# Patient Record
Sex: Female | Born: 1935 | Race: White | Hispanic: No | Marital: Married | Smoking: Never smoker
Health system: Southern US, Community
[De-identification: ages and names within clinical notes are randomized; demographics above are authoritative.]

---

## 2015-01-14 ENCOUNTER — Other Ambulatory Visit: Payer: Self-pay | Admitting: Internal Medicine

## 2015-01-14 DIAGNOSIS — J84112 Idiopathic pulmonary fibrosis: Secondary | ICD-10-CM

## 2015-01-14 DIAGNOSIS — Z006 Encounter for examination for normal comparison and control in clinical research program: Secondary | ICD-10-CM

## 2015-01-18 ENCOUNTER — Encounter (INDEPENDENT_AMBULATORY_CARE_PROVIDER_SITE_OTHER): Payer: Self-pay

## 2015-01-18 ENCOUNTER — Other Ambulatory Visit: Payer: Self-pay | Admitting: Internal Medicine

## 2015-01-18 ENCOUNTER — Ambulatory Visit (INDEPENDENT_AMBULATORY_CARE_PROVIDER_SITE_OTHER): Payer: Medicare Other | Admitting: Internal Medicine

## 2015-01-18 ENCOUNTER — Ambulatory Visit (HOSPITAL_COMMUNITY)
Admission: RE | Admit: 2015-01-18 | Discharge: 2015-01-18 | Disposition: A | Discharge status: Home / Self Care | Payer: Medicare Other | Source: Ambulatory Visit | Attending: Internal Medicine | Admitting: Internal Medicine

## 2015-01-18 ENCOUNTER — Ambulatory Visit (INDEPENDENT_AMBULATORY_CARE_PROVIDER_SITE_OTHER)
Admission: RE | Admit: 2015-01-18 | Discharge: 2015-01-18 | Disposition: A | Discharge status: Home / Self Care | Payer: Medicare Other | Source: Ambulatory Visit | Attending: Internal Medicine | Admitting: Internal Medicine

## 2015-01-18 DIAGNOSIS — Z006 Encounter for examination for normal comparison and control in clinical research program: Secondary | ICD-10-CM | POA: Insufficient documentation

## 2015-01-18 DIAGNOSIS — J84112 Idiopathic pulmonary fibrosis: Secondary | ICD-10-CM | POA: Insufficient documentation

## 2015-01-18 LAB — PULMONARY FUNCTION TEST
DL/VA % pred: 80 %
DL/VA: 3.42 ml/min/mmHg/L
DLCO unc % pred: 48 %
DLCO unc: 9.09 ml/min/mmHg
FEF 25-75 Pre: 3.25 L/sec
FEF2575-%Pred-Pre: 239 %
FEV1-%PRED-PRE: 101 %
FEV1-Pre: 1.72 L
FEV1FVC-%Pred-Pre: 122 %
FEV6-%Pred-Pre: 86 %
FEV6-Pre: 1.86 L
FEV6FVC-%PRED-PRE: 106 %
FVC-%Pred-Pre: 81 %
FVC-Pre: 1.86 L
PRE FEV1/FVC RATIO: 92 %
Pre FEV6/FVC Ratio: 100 %

## 2015-01-18 NOTE — Patient Instructions (Signed)
ICD-9-CM ICD-10-CM   1. Research study patient V70.7 Z00.6   2. IPF (idiopathic pulmonary fibrosis) 516.31 J84.112

## 2015-01-18 NOTE — Progress Notes (Signed)
PCP Bevely Palmer (Oct. 02, 2015 - Present) [phone] (Work) [phone] (Fax)  [place], [place] [address]  DR FUlkerson - Pulmonary  Fulkerson, Chevis Pretty, MD  [facility] [address]  [place], [name] [address]  [phone]  [phone] (Fax)  S: [place] ([place].gov Identifier: [id number])  RESEARCH SUBJECT. Phase 2, randomized, double-blind, placebo-controlled study to evaluate the safety and tolerability of FG-3019 in subjects with IPF, and the efficacy of FG-3019 in slowing the loss of forced vital capacity (FVC) and the progression of IPF in these subjects. FG-3019 is a human recombinant monoclonal antibody that targets connective tissue growth factor (CTGF), which is a key factor in the pathogenesis of fibrosis. The ability of FG-3019 to block CTGF makes it a candidate for treating IPF.PRAISE is sponsored by [employer].  [employer] is a [employer] located in [place], [place]. FG-3019 is administered as an IV infusion every three weeks .    Inclusion Criteria: IPF for </= 5 years, age 51-80 and FVC is >55% and your DLCO >/30% and body weight < 130kg  Key other data: side effects with infusion - Cough > 30%, Fatigue > 20%, Dyspnea > 20%, URI > 15%, Headache > 15%, Diarrhea > 15%. Rare is flushing, arm pain, back pain and hypertension. SEverity 75% is miild-moderate. > 74% have atleast 1 side effect over time. So far none discontinued or died during treatment,. No overall difference between placebo and drug        IPF Hx per [facility]  - IPF dx nov 2012 Monica Matthews primary care and referred to pulmonary [place] -> [facility] Dr Hollace Kinnier  - symp at dx: diagnosed o physical "velcro crackles" ; asymptomatic at that time  - hx : denies autoimmune symptoms or occupational symptoms. Does not recollect autoimmune workup but Dr Cyndie Chime notes may 2013 indicates no collagen vascular disease history and no exposure hx to account for  alternate diagnosis  - symptoms currently: Phlegm in throat - mild,  Very mild occ cough, very mild dyspnea on exertion ath she hardly notices  - objective: per patient and hyusband: PFTs decline gradually +  - Followup: [facility] Dr Cyndie Chime  -Rx:    [place]key Gave [id number] x weeks - resulted in ER visit due to intolerance  Had Gyn bleeding, N+, V+, Diarrhea +. But bleeding is what stopped her Monica Matthews - Sept 2015 -> escalating doses at full dose resulted in cough that was severe. Rechallenge Jan 2016:took it up at very slow pace and as of 1 month ago still at 2tab tid but at this point when she went to 3pills tid developed rash, blisters on neck/chest and forearm and so stopped. She is off esberiet x 2 weeks   - CUrently on NAC . NOt on prednisone  PFT data Dec 2012 [facility]: FVC of 2.33(97%), FEV1 of 2.08 (115%) an FEV 1% 89 diffusion lung capacity was 11.7 (62%).    12/09/2011:FVC of 2.53 (110%) , FEV1 of 2.23 (127%), an FEV 1% of 88. Vital capacity 2.53 (110%) total lung capacity 3.75 (85%) and diffusion lung capacity 14.0 (86%).    11/10/2014: Dr Cyndie Chime at [facility] compared to his dec 2015 data:   Laboratory Studies:" Pulmonary Functions studies today compared to December are slightly decreased" FVC 2.00 (90%) compared to 2.15 (96%), FEV1 1.78 (106%) compared to 1.86 (111%) FEV! % 89 TLC 3.19 (72%) compared to 3.31 (75%) Vital Capacity 2.13 (  95%) compared to 2.31 (104%) DLCO 9.6 (61%) and 10.3 (65%)    This visit 01/18/2015  for Monica Matthews with 08-Jul-1936 who is Subject Number [id number]  is a RESEARCH Visit and is for purpose of SCREENING/BASELINE  - She is not  Considering esbriet at 2 pills tid. SHe wans to be on Study   - She is heere with husband. Both very interested in sutdy against placebo. Wanted to see data leading upto PRAISE study; went over it in detail.  She seems to meet I/E criteria  CT 11/11/13 at [facility] and read by radiologist Dr Monica Matthews  4Volumetric non-contrast chest CT acquisition was performed from the lower neck to the adrenal glands. Images were reconstructed as follows: 5 x 5 mm axial sections; 1.25 x 1.25 mm axial sections; 3 x 3 mm sagittal and coronal sections. 5 x 5 mm axial maximum intensity projection images (MIPS) were reconstructed to facilitate lung nodule detection.  Indication: 90 Postinflammatory pulmonary fibrosis, ILD  Compare: April 29, 2013, April 16, 2012, June 07, 2011  Findings:  Again seen are bilateral breast implants.  Subpleural irregular linear opacities and a few scattered groundglass attenuation opacities are stable. There may be mild traction bronchiectasis, and perhaps very early honeycombing in the posterior basis,. There is a 5 mm right lower lobe nodule image 297 series 3 which is unchanged. No new parenchymal opacities are noted. There is no axillary hilar or mediastinal lymphadenopathy. Great vessels are normal in course and caliber. Coronary artery calcification noted. No pleural or pericardial fluid. Limited noncontrast images of the upper abdomen are unremarkable. No destructive osseous lesions.  Impression:  No change from recent CT in findings, which best correspond with the ATS classification: Possible UIP pattern. When compared to the most remote prior examination, June 07, 2011, there may have been very slight interval progression.  Electronically Signed by: Monica Mc, MD Electronically Signed on: 11/11/2013 8:59 AM     PAST MED Hx from PCP notes [facility] 07/10/14 and dr Cyndie Chime pulm   IPF-- follow up pulmonology [facility]  Systolic murmur-- echo (21/30/86): EF >55%, mild pulmonary htn, mild MR, mild AR.   htn-- controlled. Home bp reasonable.  Hypothyroidism-- current regimen includes levothyroxine 75 mcg daily. TSH 0.55 (04/20/14). Check TSH.   Allergic rhinitis-- current regimen includes flonase.   Migraine  headaches-- current regimen includes imitrex prn.   Anxiety-- current regimen includes xanax 0.5 mg nightly for sleep for many years  GERD    Normocytic anemia-- hgb 11.4 (baseline 12.2 as of 05/2014).  OTHER CHART INFO from [facility]   ECHO 06/02/14 - [facility]  ECG rhythm::Sinus rhythm.  Study quality::This was a technically adequate study.  Left Ventricle::The left ventricle size is normal. Left ventricular wall thickness  is normal. Regional wall motion analysis is within normal limits.  Overall left ventricular systolic function is normal, with an EF of  >55%.  Left Atrium::The left atrium is normal in size and function.  Right Ventricle::The right ventricle is normal in size and function.  Right Atrium::The right atrium is normal in size and function.  Aortic Valve::Aortic valve is trileaflet and is mildly thickened. There is mild  aortic regurgitation. There is no evidence of aortic stenosis.  Mitral Valve::Mild mitral regurgitation is present. Mild mitral annular  calcification present.  Tricuspid Valve::Mild tricuspid regurgitation present. There is mild pulmonary  hypertension. The right ventricular systolic pressure, as measured  by Doppler, is 48.82mHg.  Pulmonic Valve::Pulmonic valve  appears structurally normal.  Pericardium::The pericardium is normal. There is no pericardial effusion.  Aorta::The visualized portions of the aortic root and ascending aorta are  normal.  -------------   O  Exam - documented in sheet (research) - basal 1/4/th crackes    LABS LFT 11/10/14 at [facility] - alk phos 121 and slightly up, AST 28, ALT 19 and bilit 0/.2 Creat 0.25m% CBC - hgb 11.8gm%, WBC 7/4, plt 229  [employer] done after this visit with PCP - ANA and cCP - negative 01/25/15 approx date  A/P   ICD-9-CM ICD-10-CM   1. Research study patient V70.7 Z00.6   2. IPF (idiopathic pulmonary fibrosis) 516.31 J84.112     Went over PRAISE study in  details  Other issues discussed with her and husbaned    1. Scientific Purpose  Clinical research is designed to produce generalizable knowledge and to answer questions about the safety and efficacy of intervention(s) under study in order to determine whether or not they may be useful for the care of future patients.  2. Study Procedures  Participation in a trial may involve procedures or tests, in addition to the intervention(s) under study, that are intended only or primarily to generate scientific knowledge and that are otherwise not necessary for patient care.   3. Uncertainty  For intervention(s) under study in clinical research, there often is less knowledge and more uncertainty about the risks and benefits to a population of trial participants than there is when a doctor offers a patient standard interventions.   4. Adherence to Protocol  Administration of the intervention(s) under study is typically based on a strict protocol with defined dose, scheduling, and use or avoidance of concurrent medications, compared to administration of standard interventions.  5. Clinician as Investigator  Clinicians who are in health care settings provide treatment; in a clinical trial setting, they are also investigating safety and efficacy of an intervention. In otherwise your doctor or nurse practitioner can be wearing 2 hats - one as care giver another as rCompany secretary In this particular situation her clinician pulmonary is Dr [facility]yndie [facility]at [facility]and we at PHudson Hospitalwill be her investigators.   6. Patient as VVisual merchandiserSubject  Patients participating in research trials are research subjects or volunteers. In other words participating in research is 100% voluntary and at one's own free weill. The decision to participate or not participate will NOT affect patient care and the doctor-patient relationship in any way. Advised she will be wearing a rResearch scientist (physical sciences)hat with  [place] 7. Financial Conflict of Interest Disclosure PI and SubIs have  investment interest in [employer] the clinical trials site and  both the company and the investigators are being compensated for their effort in trial research activities. At no point is anyone being directly compensated merely for referring you to research participation  She has agreed to proceed with the process - will need research PFT and CT chest - these will nto be standard of care   Dr. MBrand Males M.D., FEnloe Medical Center - Cohasset CampusC.P Pulmonary and Critical Care Medicine Staff Physician CMonumentPulmonary and Critical Care Pager: [phone] If no answer or between  15:00h - 7:00h: call [phone]  01/28/2015 5:39 AM

## 2015-01-28 ENCOUNTER — Inpatient Hospital Stay (HOSPITAL_COMMUNITY): Admission: RE | Admit: 2015-01-28 | Payer: Medicare Other | Source: Ambulatory Visit

## 2015-01-29 ENCOUNTER — Other Ambulatory Visit: Payer: Self-pay | Admitting: *Deleted

## 2015-01-29 ENCOUNTER — Ambulatory Visit (HOSPITAL_COMMUNITY)
Admission: RE | Admit: 2015-01-29 | Discharge: 2015-01-29 | Disposition: A | Discharge status: Home / Self Care | Payer: Medicare Other | Source: Ambulatory Visit | Attending: Internal Medicine | Admitting: Internal Medicine

## 2015-01-29 ENCOUNTER — Ambulatory Visit (INDEPENDENT_AMBULATORY_CARE_PROVIDER_SITE_OTHER): Payer: Medicare Other | Admitting: Internal Medicine

## 2015-01-29 DIAGNOSIS — Z006 Encounter for examination for normal comparison and control in clinical research program: Secondary | ICD-10-CM | POA: Diagnosis not present

## 2015-01-29 DIAGNOSIS — J84112 Idiopathic pulmonary fibrosis: Secondary | ICD-10-CM | POA: Diagnosis not present

## 2015-01-29 MED ORDER — STUDY - INVESTIGATIONAL DRUG SIMPLE RECORD (ML): 250.0000 mL/h | Status: DC

## 2015-01-29 MED ORDER — STUDY - INVESTIGATIONAL DRUG SIMPLE RECORD (ML)
125.0000 mL/h | Freq: Once | Status: AC
  Administered 2015-01-29: 125 mL/h via INTRAVENOUS
  Filled 2015-01-29: qty 180

## 2015-01-29 MED ORDER — STUDY - INVESTIGATIONAL DRUG SIMPLE RECORD (ML)
125.0000 mL/h | Freq: Once | Status: DC
  Filled 2015-01-29: qty 180

## 2015-01-29 NOTE — Progress Notes (Signed)
Dr. Marchelle Gearing explained side effects to patient and family member. Both asked questions and verbalized understanding.Research nurse at bedside.

## 2015-02-18 ENCOUNTER — Encounter (HOSPITAL_COMMUNITY)
Admission: RE | Admit: 2015-02-18 | Discharge: 2015-02-18 | Disposition: A | Discharge status: Home / Self Care | Payer: Medicare Other | Source: Ambulatory Visit | Attending: Internal Medicine | Admitting: Internal Medicine

## 2015-02-18 DIAGNOSIS — Z006 Encounter for examination for normal comparison and control in clinical research program: Secondary | ICD-10-CM | POA: Insufficient documentation

## 2015-02-18 DIAGNOSIS — J84112 Idiopathic pulmonary fibrosis: Secondary | ICD-10-CM | POA: Insufficient documentation

## 2015-02-18 MED ORDER — STUDY - INVESTIGATIONAL DRUG SIMPLE RECORD (ML)
250.0000 mL/h | Freq: Once | Status: AC
  Administered 2015-02-18: 250 mL/h via INTRAVENOUS
  Filled 2015-02-18: qty 177.6

## 2015-03-10 ENCOUNTER — Ambulatory Visit (INDEPENDENT_AMBULATORY_CARE_PROVIDER_SITE_OTHER): Payer: Medicare Other | Admitting: Adult Health

## 2015-03-10 ENCOUNTER — Ambulatory Visit (HOSPITAL_COMMUNITY)
Admission: RE | Admit: 2015-03-10 | Discharge: 2015-03-10 | Disposition: A | Discharge status: Home / Self Care | Payer: Medicare Other | Source: Ambulatory Visit | Attending: Internal Medicine | Admitting: Internal Medicine

## 2015-03-10 VITALS — BP 102/80 | HR 76 | Temp 97.6°F | Resp 16 | Wt 131.0 lb

## 2015-03-10 DIAGNOSIS — Z006 Encounter for examination for normal comparison and control in clinical research program: Secondary | ICD-10-CM | POA: Insufficient documentation

## 2015-03-10 DIAGNOSIS — J84112 Idiopathic pulmonary fibrosis: Secondary | ICD-10-CM

## 2015-03-10 MED ORDER — STUDY - INVESTIGATIONAL DRUG SIMPLE RECORD (ML)
125.0000 mL/h | Freq: Once | Status: AC
  Administered 2015-03-10: 500 mL/h via INTRAVENOUS
  Filled 2015-03-10: qty 178.2

## 2015-03-10 NOTE — Patient Instructions (Signed)
Continue with research protocol.  

## 2015-03-10 NOTE — Assessment & Plan Note (Signed)
Compensated without flare 

## 2015-03-10 NOTE — Progress Notes (Signed)
PRAISE (ClinicalTrials.gov Identifier: [id number])  RESEARCH SUBJECT. Phase 2, randomized, double-blind, placebo-controlled study to evaluate the safety and tolerability of FG-3019 in subjects with IPF, and the efficacy of FG-3019 in slowing the loss of forced vital capacity (FVC) and the progression of IPF in these subjects. FG-3019 is a human recombinant monoclonal antibody that targets connective tissue growth factor (CTGF), which is a key factor in the pathogenesis of fibrosis. The ability of FG-3019 to block CTGF makes it a candidate for treating IPF.PRAISE is sponsored by [employer].  [employer] is a [employer] located in [place], [place]. FG-3019 is administered as an IV infusion every three weeks .    Inclusion Criteria: IPF for </= 5 years, age 30-80 and FVC is >55% and your DLCO >/30% and body weight < 130kg  Key other data: side effects with infusion - Cough > 30%, Fatigue > 20%, Dyspnea > 20%, URI > 15%, Headache > 15%, Diarrhea > 15%. Rare is flushing, arm pain, back pain and hypertension. SEverity 75% is miild-moderate. > 74% have atleast 1 side effect over time. So far none discontinued or died during treatment,. No overall difference between placebo and drug   This visit 03/10/2015  for Monica Matthews with 06/22/1936 who is Subject Number   is a research Visit and is for purpose of follow up with infusion to follow at [facility] and is week 6 on PROTOCOL.  Subject states, doing well....seems to be having more body heat(?).  Will go over calender prior to leaving for hospital infusion.  Subject has been randomized and Pharmacy notified of Weight.

## 2015-03-10 NOTE — Assessment & Plan Note (Signed)
Appears to be doing well with study with no apparent adverse effects.  Cont w/ research protocol

## 2015-03-10 NOTE — Progress Notes (Signed)
   Subjective:    Patient ID: Monica Matthews, female    DOB: [birth year], 79 y.o.   MRN: [id number]  HPI  PRAISE (ClinicalTrials.gov Identifier: [id number]) RESEARCH SUBJECT. Phase 2, randomized, double-blind, placebo-controlled study to evaluate the safety and tolerability of FG-3019 in subjects with IPF, and the efficacy of FG-3019 in slowing the loss of forced vital capacity (FVC) and the progression of IPF in these subjects. FG-3019 is a human recombinant monoclonal antibody that targets connective tissue growth factor (CTGF), which is a key factor in the pathogenesis of fibrosis. The ability of FG-3019 to block CTGF makes it a candidate for treating IPF.PRAISE is sponsored by [employer]. [employer] is a [employer] located in [place], [place]. FG-3019 is administered as an IV infusion every three weeks .  Inclusion Criteria: IPF for </= 5 years, age 46-80 and FVC is >55% and your DLCO >/30% and body weight < 130kg   Key other data: side effects with infusion - Cough > 30%, Fatigue > 20%, Dyspnea > 20%, URI > 15%, Headache > 15%, Diarrhea > 15%. Rare is flushing, arm pain, back pain and hypertension. SEverity 75% is miild-moderate. > 74% have atleast 1 side effect over time. So far none discontinued or died during treatment,. No overall difference between placebo and drug   This visit 03/10/2015 for Monica Matthews with [birth year] who is Subject Number is a research Visit and is for purpose of follow up with infusion to follow at [facility] and is week 6 on PROTOCOL.   Subject states, doing well....seems to be having more body heat(?).  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  Can go up stairs with minimal dyspena//DOE.  No dyspnea at rest or at gym.  Minimal cough .  Does get fatigue at baseline, no change since starting drug therapy.  Denies chest pain, orthopnea, edema , fever, hemoptysis , n/v/d.  Good appetite    Review of Systems    Reviewed See hpi          Objective:   Physical Exam GEN: A/Ox3; pleasant , NAD, well nourished , appears younger than stated age   HEENT:  [place]/AT,  EACs-clear,, NOSE-clear, THROAT-clear, no lesions, no postnasal drip or exudate noted.   NECK:  Supple w/ fair ROM; no JVD; normal carotid impulses w/o bruits; no thyromegaly or nodules palpated; no lymphadenopathy.  RESP  Bibasilar crackles w/ no wheezing .no accessory muscle use, no dullness to percussion  CARD:  RRR, no m/r/g  , no peripheral edema, pulses intact, no cyanosis or clubbing.  GI:   Soft & nt;   Musco: Warm bil, no deformities or joint swelling noted.   Neuro: alert, no focal deficits noted.    Skin: Warm, no lesions or rashes '       Assessment & Plan:

## 2015-04-05 ENCOUNTER — Encounter (HOSPITAL_COMMUNITY)
Admission: RE | Admit: 2015-04-05 | Discharge: 2015-04-05 | Disposition: A | Discharge status: Home / Self Care | Payer: Medicare Other | Source: Ambulatory Visit | Attending: Internal Medicine | Admitting: Internal Medicine

## 2015-04-05 DIAGNOSIS — Z006 Encounter for examination for normal comparison and control in clinical research program: Secondary | ICD-10-CM | POA: Insufficient documentation

## 2015-04-05 DIAGNOSIS — J84112 Idiopathic pulmonary fibrosis: Secondary | ICD-10-CM | POA: Insufficient documentation

## 2015-04-05 MED ORDER — STUDY - INVESTIGATIONAL DRUG SIMPLE RECORD (ML)
125.0000 mL/h | Freq: Once | Status: AC
  Administered 2015-04-05: 500 mL/h via INTRAVENOUS
  Filled 2015-04-05: qty 177.6

## 2015-04-26 ENCOUNTER — Encounter (HOSPITAL_COMMUNITY)
Admission: RE | Admit: 2015-04-26 | Discharge: 2015-04-26 | Disposition: A | Discharge status: Home / Self Care | Payer: Medicare Other | Source: Ambulatory Visit | Attending: Internal Medicine | Admitting: Internal Medicine

## 2015-04-26 ENCOUNTER — Ambulatory Visit (INDEPENDENT_AMBULATORY_CARE_PROVIDER_SITE_OTHER): Payer: Medicare Other | Admitting: Adult Health

## 2015-04-26 VITALS — BP 110/76 | HR 65 | Temp 97.5°F | Resp 16 | Wt 132.4 lb

## 2015-04-26 DIAGNOSIS — J84112 Idiopathic pulmonary fibrosis: Secondary | ICD-10-CM | POA: Insufficient documentation

## 2015-04-26 DIAGNOSIS — Z006 Encounter for examination for normal comparison and control in clinical research program: Secondary | ICD-10-CM | POA: Insufficient documentation

## 2015-04-26 MED ORDER — STUDY - INVESTIGATIONAL DRUG SIMPLE RECORD (ML)
125.0000 mL/h | Freq: Once | Status: AC
  Administered 2015-04-26: 500 mL/h via INTRAVENOUS
  Filled 2015-04-26: qty 180.3

## 2015-04-26 MED ORDER — STUDY - INVESTIGATIONAL DRUG SIMPLE RECORD (ML)
125.0000 mL/h | Freq: Once | Status: AC
  Filled 2015-04-26 (×3): qty 180.3

## 2015-04-26 NOTE — Assessment & Plan Note (Signed)
Compensated on present regimen .  Cont with follow up with Dr. Marchelle Gearing as planned

## 2015-04-26 NOTE — Assessment & Plan Note (Signed)
Cont with research protocol  

## 2015-04-26 NOTE — Progress Notes (Signed)
   Subjective:    Patient ID: Monica Matthews, female    DOB: 04/16/1936, 79 y.o.   MRN: [id number]  HPI   PRAISE (ClinicalTrials.gov Identifier: [id number]) RESEARCH SUBJECT. Phase 2, randomized, double-blind, placebo-controlled study to evaluate the safety and tolerability of FG-3019 in subjects with IPF, and the efficacy of FG-3019 in slowing the loss of forced vital capacity (FVC) and the progression of IPF in these subjects. FG-3019 is a human recombinant monoclonal antibody that targets connective tissue growth factor (CTGF), which is a key factor in the pathogenesis of fibrosis. The ability of FG-3019 to block CTGF makes it a candidate for treating IPF.PRAISE is sponsored by [employer]. [employer] is a [employer] located in [place], [place]. FG-3019 is administered as an IV infusion every three weeks .  Inclusion Criteria: IPF for </= 5 years, age 23-80 and FVC is >55% and your DLCO >/30% and body weight < 130kg   Key other data: side effects with infusion - Cough > 30%, Fatigue > 20%, Dyspnea > 20%, URI > 15%, Headache > 15%, Diarrhea > 15%. Rare is flushing, arm pain, back pain and hypertension. SEverity 75% is miild-moderate. > 74% have atleast 1 side effect over time. So far none discontinued or died during treatment,. No overall difference between placebo and drug   04/26/2015 Research visit : Subject Number  number 7 on PROTOCOL. Pt returns for research visit .  Says she is doing well overall.  PFT today reviewed with 10% decline noted in FVC 83 last visit and 79% today  Has been treated for a UTI , on abx . Feels better.  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  Can go up stairs with minimal dyspena//DOE.  No dyspnea at rest or at gym.  Minimal cough .  Does get fatigue at baseline, no change since starting drug therapy.  Had one episode of fluttering sensation inside body that resolved shortly with no intervention    Denies chest pain or syncope. Good appetite   Review of Systems    Reviewed See hpi          Objective:   Physical Exam  GEN: A/Ox3; pleasant , NAD, well nourished , appears younger than stated age   HEENT:  [place]/AT,  EACs-clear,, NOSE-clear, THROAT-clear, no lesions, no postnasal drip or exudate noted.   NECK:  Supple w/ fair ROM; no JVD; normal carotid impulses w/o bruits; no thyromegaly or nodules palpated; no lymphadenopathy.  RESP  Bibasilar crackles w/ no wheezing .no accessory muscle use, no dullness to percussion  CARD:  RRR, no m/r/g  , no peripheral edema, pulses intact, no cyanosis or clubbing.  GI:   Soft & nt;   Musco: Warm bil, no deformities or joint swelling noted.   Neuro: alert, no focal deficits noted.    Skin: Warm, no lesions or rashes '       Assessment & Plan:

## 2015-04-26 NOTE — Progress Notes (Signed)
PRAISE (ClinicalTrials.gov Identifier: [id number])  RESEARCH SUBJECT. Phase 2, randomized, double-blind, placebo-controlled study to evaluate the safety and tolerability of FG-3019 in subjects with IPF, and the efficacy of FG-3019 in slowing the loss of forced vital capacity (FVC) and the progression of IPF in these subjects. FG-3019 is a human recombinant monoclonal antibody that targets connective tissue growth factor (CTGF), which is a key factor in the pathogenesis of fibrosis. The ability of FG-3019 to block CTGF makes it a candidate for treating IPF.PRAISE is sponsored by [employer].  [employer] is a [employer] located in [place], [place]. FG-3019 is administered as an IV infusion every three weeks .    Inclusion Criteria: IPF for </= 5 years, age 82-80 and FVC is >55% and your DLCO >/30% and body weight < 130kg  Key other data: side effects with infusion - Cough > 30%, Fatigue > 20%, Dyspnea > 20%, URI > 15%, Headache > 15%, Diarrhea > 15%. Rare is flushing, arm pain, back pain and hypertension. SEverity 75% is miild-moderate. > 74% have atleast 1 side effect over time. So far none discontinued or died during treatment,. No overall difference between placebo and drug   This visit 04/26/2015  for Monica Matthews with 12/27/1935 who is Subject Number [id number] is a research Visit and is for purpose of follow up and is number 7 on PROTOCOL. Subject is doing well, has been treated for a UTI and is finishing up antibiotic, this is documented on AE form.  Will head to hospital following appt with tammy .

## 2015-04-26 NOTE — Patient Instructions (Signed)
Continue with research protocol.  

## 2015-05-17 ENCOUNTER — Encounter (HOSPITAL_COMMUNITY): Payer: Medicare Other

## 2015-05-21 ENCOUNTER — Encounter (HOSPITAL_COMMUNITY)
Admission: RE | Admit: 2015-05-21 | Discharge: 2015-05-21 | Disposition: A | Discharge status: Home / Self Care | Payer: Medicare Other | Source: Ambulatory Visit | Attending: Internal Medicine | Admitting: Internal Medicine

## 2015-05-21 MED ORDER — STUDY - INVESTIGATIONAL DRUG SIMPLE RECORD (ML)
125.0000 mL/h | Freq: Once | Status: AC
  Administered 2015-05-21: 125 mL/h via INTRAVENOUS
  Filled 2015-05-21: qty 180.3
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
## 2015-06-07 ENCOUNTER — Ambulatory Visit (INDEPENDENT_AMBULATORY_CARE_PROVIDER_SITE_OTHER): Payer: Medicare Other | Admitting: Adult Health

## 2015-06-07 ENCOUNTER — Encounter (HOSPITAL_COMMUNITY)
Admission: RE | Admit: 2015-06-07 | Discharge: 2015-06-07 | Disposition: A | Discharge status: Home / Self Care | Payer: Medicare Other | Source: Ambulatory Visit | Attending: Internal Medicine | Admitting: Internal Medicine

## 2015-06-07 VITALS — BP 102/74 | HR 62 | Temp 98.2°F | Resp 17 | Wt 132.2 lb

## 2015-06-07 DIAGNOSIS — Z006 Encounter for examination for normal comparison and control in clinical research program: Secondary | ICD-10-CM

## 2015-06-07 DIAGNOSIS — J84112 Idiopathic pulmonary fibrosis: Secondary | ICD-10-CM

## 2015-06-07 MED ORDER — STUDY - INVESTIGATIONAL DRUG SIMPLE RECORD (ML)
125.0000 mL/h | Freq: Once | Status: AC
  Administered 2015-06-07: 500 mL/h via INTRAVENOUS
  Filled 2015-06-07: qty 180.03

## 2015-06-07 NOTE — Patient Instructions (Addendum)
Continue with research protocol .  May use saline nasal spray As needed

## 2015-06-07 NOTE — Progress Notes (Signed)
   Subjective:    Patient ID: Monica Matthews, female    DOB: Jan 15, 1936, 79 y.o.   MRN: [id number]  HPI PRAISE (ClinicalTrials.gov Identifier: [id number]) RESEARCH SUBJECT. Phase 2, randomized, double-blind, placebo-controlled study to evaluate the safety and tolerability of FG-3019 in subjects with IPF, and the efficacy of FG-3019 in slowing the loss of forced vital capacity (FVC) and the progression of IPF in these subjects. FG-3019 is a human recombinant monoclonal antibody that targets connective tissue growth factor (CTGF), which is a key factor in the pathogenesis of fibrosis. The ability of FG-3019 to block CTGF makes it a candidate for treating IPF.PRAISE is sponsored by [employer] is a [employer] company located in [place], [place]. FG-3019 is administered as an IV infusion every three weeks .  Inclusion Criteria: IPF for </= 5 years, age 340-80 and FVC is >55% and your DLCO >/30% and body weight < 130kg   Key other data: side effects with infusion - Cough > 30%, Fatigue > 20%, Dyspnea > 20%, URI > 15%, Headache > 15%, Diarrhea > 15%. Rare is flushing, arm pain, back pain and hypertension. SEverity 75% is miild-moderate. > 74% have atleast 1 side effect over time. So far none discontinued or died during treatment,. No overall difference between placebo and drug   06/07/2015  Research visit : Pt returns for research visit .  Says she is doing well overall.   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  Has had some Sore throat and drainage. We discussed saline nasal spray As needed   She denies chest pain, hemoptysis , orthopnea or edema.   Review of Systems    Reviewed See hpi          Objective:   Physical Exam  GEN: A/Ox3; pleasant , NAD, well nourished , appears younger than stated age   HEENT:  [place]/AT,  EACs-clear,, NOSE-clear, THROAT-clear, no lesions, no postnasal drip or exudate noted.   NECK:  Supple w/ fair ROM; no JVD; normal carotid  impulses w/o bruits; no thyromegaly or nodules palpated; no lymphadenopathy.  RESP  Bibasilar crackles w/ no wheezing .no accessory muscle use, no dullness to percussion  CARD:  RRR, no m/r/g  , no peripheral edema, pulses intact, no cyanosis or clubbing.  GI:   Soft & nt;   Musco: Warm bil, no deformities or joint swelling noted.   Neuro: alert, no focal deficits noted.    Skin: Warm, no lesions or rashes '       Assessment & Plan:

## 2015-06-07 NOTE — Progress Notes (Signed)
[facility] (ClinicalTrials.gov Identifier: [id number])  RESEARCH SUBJECT. Phase 2, randomized, double-blind, placebo-controlled study to evaluate the safety and tolerability of FG-3019 in subjects with IPF, and the efficacy of FG-3019 in slowing the loss of forced vital capacity (FVC) and the progression of IPF in these subjects. FG-3019 is a human recombinant monoclonal antibody that targets connective tissue growth factor (CTGF), which is a key factor in the pathogenesis of fibrosis. The ability of FG-3019 to block CTGF makes it a candidate for treating IPF.[facility]ISE is sponsored by [employer]  [employer] is a [employer] company located in [place], [place]. FG-3019 is administered as an IV infusion every three weeks .    Inclusion Criteria: IPF for </= 5 years, age 77-80 and FVC is >55% and your DLCO >/30% and body weight < 130kg  Key other data: side effects with infusion - Cough > 30%, Fatigue > 20%, Dyspnea > 20%, URI > 15%, Headache > 15%, Diarrhea > 15%. Rare is flushing, arm pain, back pain and hypertension. SEverity 75% is miild-moderate. > 74% have atleast 1 side effect over time. So far none discontinued or died during treatment,. No overall difference between placebo and drug   This visit 06/07/2015  for Monica Matthews with 1935-12-17 who is Subject Number [id number]  is a research Visit and is for purpose of treatment  and is number Week 18 on PROTOCOL.  Subject returns, is doing well.  Husband had many questions about what will happen at end of 48 weeks, unable to answer those at this time.  Monica Matthews notified subject put in end point.

## 2015-06-07 NOTE — Assessment & Plan Note (Signed)
Appears at baseline Cont w/ current regimen

## 2015-06-07 NOTE — Assessment & Plan Note (Signed)
Cont w/ research protocol  

## 2015-06-07 NOTE — Progress Notes (Signed)
Research nurse here with patient.

## 2015-06-08 ENCOUNTER — Telehealth: Payer: Self-pay | Admitting: Internal Medicine

## 2015-06-08 NOTE — Telephone Encounter (Signed)
Subject called complaining of being blurry this am, was treated yesterday 31Oct2016 for Fibrogen.  Is concerned this is an Adverse Reaction to the study drug.  Spoke with monitor, Vernard GamblesCarolyn Coleman who was not aware of any type of information relating to blurred vision following treatment.  Advised to call Dr. Tresa EndoGorina (medical monitor for Fibrogen).  Per Dr. Marchelle Gearingamaswamy after speaking with Dr. Tresa EndoGorina, there is <10% reported cases of blurred vision and none were study drug related.  This information has been relayed to subject and also advised an appointment with her primary care provider today.  Subject was receptive to information and stated she had an appointment already scheduled for today at 1645 with her Dr. For a "Check up".  Asked subject to follow up with me to let me know how she makes out, she stated she would and was appreciative of the quick response given by our office.  Dr. Marchelle Gearingamaswamy is willing to speak with primary MD if there is a need.

## 2015-06-10 NOTE — Patient Instructions (Signed)
ICD-9-CM ICD-10-CM   1. Research study patient V70.7 Z00.6   2. IPF (idiopathic pulmonary fibrosis) (HCC) 516.31 J84.112     Infusion per proptocol

## 2015-06-10 NOTE — Progress Notes (Signed)
PCP Assunta Gamblesaniel P Dawson (Oct. 02, 2015 - Present) [phone] (Work) [phone] (Fax)  [address] [place], [place] [address]  DR Guido SanderFUlkerson - Pulmonary  Guido SanderFulkerson, Juluis MireWilliam Jennings, MD  [address] [facility] [address]  [facility] [address]  [place], [place] [address]  [phone]  [phone] (Fax)  S: [facility] (ClinicalTrials.gov Identifier: [id number])  RESEARCH SUBJECT. Phase 2, randomized, double-blind, placebo-controlled study to evaluate the safety and tolerability of FG-3019 in subjects with IPF, and the efficacy of FG-3019 in slowing the loss of forced vital capacity (FVC) and the progression of IPF in these subjects. FG-3019 is a human recombinant monoclonal antibody that targets connective tissue growth factor (CTGF), which is a key factor in the pathogenesis of fibrosis. The ability of FG-3019 to block CTGF makes it a candidate for treating IPF.[facility]ISE is sponsored by [employer]  [employer] is a [employer] company located in [place]. FG-3019 is administered as an IV infusion every three weeks .    Inclusion Criteria: IPF for </= 5 years, age 77-80 and FVC is >55% and your DLCO >/30% and body weight < 130kg  Key other data: side effects with infusion - Cough > 30%, Fatigue > 20%, Dyspnea > 20%, URI > 15%, Headache > 15%, Diarrhea > 15%. Rare is flushing, arm pain, back pain and hypertension. SEverity 75% is miild-moderate. > 74% have atleast 1 side effect over time. So far none discontinued or died during treatment,. No overall difference between placebo and drug   June 24th is infusion visit. Endorses no complaints  O No change since prior visit this month See research source doc  A   ICD-9-CM ICD-10-CM   1. Research study patient V70.7 Z00.6   2. IPF (idiopathic pulmonary fibrosis) (HCC) 516.31 J84.112    P Infusion per protocol   Dr. Kalman ShanMurali Kasidee Voisin, M.D., [facility].P Pulmonary and Critical Care Medicine Staff Physician [facility] [place] [facility]  Critical Care Pager: [phone], If no answer or between  15:00h - 7:00h: call [phone]  06/10/2015 2:41 PM    Note signed many months later  - fyi

## 2015-06-28 ENCOUNTER — Ambulatory Visit (HOSPITAL_COMMUNITY)
Admission: RE | Admit: 2015-06-28 | Discharge: 2015-06-28 | Disposition: A | Discharge status: Home / Self Care | Payer: Medicare Other | Source: Ambulatory Visit | Attending: Internal Medicine | Admitting: Internal Medicine

## 2015-06-28 MED ORDER — SODIUM CHLORIDE 0.9 % IV SOLN
125.0000 mL/h | Freq: Once | INTRAVENOUS | Status: AC
  Administered 2015-06-28: 500 mL/h via INTRAVENOUS
  Filled 2015-06-28: qty 180.3

## 2015-06-28 NOTE — Research (Signed)
This patient has arrived for Week 21 infusion.  Patient reports new feelings of anxiety and depression, starting at end October 2016.  Patient reports had feelings of anxiety and depression at previous infusion on 06/07/15 but did not mention to study staff.  Patient decided to go to primary care provider for anxiety/depressiion in mid-November.  New prescription of sertraline 25mg  daily prescribed, and patient began taking on 06/26/15.  No changes in feelings of anxiety/depression yet.  Otherwise, no new medications or adverse events.  This RN was present for the initiation of the infusion.  Patient tolerating well, no complaints associated with infusion.  Kinnie FeilStacey Phelps, RN

## 2015-07-19 ENCOUNTER — Ambulatory Visit (HOSPITAL_COMMUNITY)
Admission: RE | Admit: 2015-07-19 | Discharge: 2015-07-19 | Disposition: A | Discharge status: Home / Self Care | Payer: Medicare Other | Source: Ambulatory Visit | Attending: Internal Medicine | Admitting: Internal Medicine

## 2015-07-19 ENCOUNTER — Other Ambulatory Visit: Payer: Self-pay | Admitting: Internal Medicine

## 2015-07-19 ENCOUNTER — Encounter: Payer: Self-pay | Admitting: Adult Health

## 2015-07-19 ENCOUNTER — Ambulatory Visit (INDEPENDENT_AMBULATORY_CARE_PROVIDER_SITE_OTHER): Payer: Medicare Other | Admitting: Adult Health

## 2015-07-19 ENCOUNTER — Encounter: Payer: Medicare Other | Admitting: Internal Medicine

## 2015-07-19 VITALS — BP 110/80 | HR 74 | Temp 97.5°F | Resp 16 | Wt 131.2 lb

## 2015-07-19 DIAGNOSIS — J84112 Idiopathic pulmonary fibrosis: Secondary | ICD-10-CM | POA: Diagnosis not present

## 2015-07-19 DIAGNOSIS — Z006 Encounter for examination for normal comparison and control in clinical research program: Secondary | ICD-10-CM

## 2015-07-19 MED ORDER — STUDY - INVESTIGATIONAL DRUG SIMPLE RECORD (ML)
125.0000 mL/h | Freq: Once | Status: AC
  Administered 2015-07-19: 500 mL/h via INTRAVENOUS
  Filled 2015-07-19: qty 179

## 2015-07-19 NOTE — Progress Notes (Signed)
   Subjective:    Patient ID: Monica Matthews, female    DOB: October 20, 1935, 79 y.o.   MRN: [id number]  HPI [facility] (ClinicalTrials.gov Identifier: [id number]) RESEARCH SUBJECT. Phase 2, randomized, double-blind, placebo-controlled study to evaluate the safety and tolerability of FG-3019 in subjects with IPF, and the efficacy of FG-3019 in slowing the loss of forced vital capacity (FVC) and the progression of IPF in these subjects. FG-3019 is a human recombinant monoclonal antibody that targets connective tissue growth factor (CTGF), which is a key factor in the pathogenesis of fibrosis. The ability of FG-3019 to block CTGF makes it a candidate for treating IPF.[facility] is sponsored by [employer] is a [employer] company located in [place], [place]. FG-3019 is administered as an IV infusion every three weeks .  Inclusion Criteria: IPF for </= 5 years, age 77-80 and FVC is >55% and your DLCO >/30% and body weight < 130kg   Key other data: side effects with infusion - Cough > 30%, Fatigue > 20%, Dyspnea > 20%, URI > 15%, Headache > 15%, Diarrhea > 15%. Rare is flushing, arm pain, back pain and hypertension. SEverity 75% is miild-moderate. > 74% have atleast 1 side effect over time. So far none discontinued or died during treatment,. No overall difference between placebo and drug   07/19/2015  Research visit :Week 24  Pt returns for research visit .  Says she is doing well overall.   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  Has had some Sore throat and drainage. She denies chest pain, hemoptysis , orthopnea or edema.   Review of Systems  +constipation and +fatigue          Objective:   Physical Exam  GEN: A/Ox3; pleasant , NAD, well nourished , appears younger than stated age   HEENT:  [place]/AT,  EACs-clear,, NOSE-clear, THROAT-clear, no lesions, no postnasal drip or exudate noted.   NECK:  Supple w/ fair ROM; no JVD; normal carotid  impulses w/o bruits; no thyromegaly or nodules palpated; no lymphadenopathy.  RESP  Bibasilar crackles w/ no wheezing .no accessory muscle use, no dullness to percussion  CARD:  RRR, no m/r/g  , no peripheral edema, pulses intact, no cyanosis or clubbing.  GI:   Soft & nt;   Musco: Warm bil, no deformities or joint swelling noted.   Neuro: alert, no focal deficits noted.    Skin: Warm, no lesions or rashes '       Assessment & Plan:

## 2015-07-19 NOTE — Assessment & Plan Note (Signed)
Doing well on study drugy  Cont w/ research protocol .

## 2015-07-19 NOTE — Research (Addendum)
[facility] (ClinicalTrials.gov Identifier: [id number])  RESEARCH SUBJECT. Phase 2, randomized, double-blind, placebo-controlled study to evaluate the safety and tolerability of FG-3019 in subjects with IPF, and the efficacy of FG-3019 in slowing the loss of forced vital capacity (FVC) and the progression of IPF in these subjects. FG-3019 is a human recombinant monoclonal antibody that targets connective tissue growth factor (CTGF), which is a key factor in the pathogenesis of fibrosis. The ability of FG-3019 to block CTGF makes it a candidate for treating IPF.[facility]AISE is sponsored by [employer]  [employer] is a [employer] company located in [place], [place]. FG-3019 is administered as an IV infusion every three weeks .    Inclusion Criteria: IPF for </= 5 years, age 77-80 and FVC is >55% and your DLCO >/30% and body weight < 130kg  Key other data: side effects with infusion - Cough > 30%, Fatigue > 20%, Dyspnea > 20%, URI > 15%, Headache > 15%, Diarrhea > 15%. Rare is flushing, arm pain, back pain and hypertension. SEverity 75% is miild-moderate. > 74% have atleast 1 side effect over time. So far none discontinued or died during treatment,. No overall difference between placebo and drug   This visit 07/19/2015  for Monica Matthews who is Subject Number [id number]  is a Research Visit and is for purpose of Week 24 on PROTOCOL.     Patient at hospital today for scheduled week 24 infusion.  Patient reported stop use of sertraline after a few days on the medication.  She reports medication made her feel anxious and did not improve symptoms.  Patient says she is a "strong person, mentally," and "worked through" her feelings of anxiety and depression on her own.  She says her feelings of anxiety and depression were triggered by the loss of two long-time, elderly pets during a short period of time.  Patient reports she feels back to baseline today.  Patient reports no other changes in medications or  health.  This RN was present for the initiation and administration of study drug infusion.  Patient tolerating infusion well. Patient will be monitored by bedside RN staff for observation period.  Kinnie FeilStacey Phelps, RN

## 2015-07-19 NOTE — Patient Instructions (Signed)
Continue with research protocol.  

## 2015-07-19 NOTE — Assessment & Plan Note (Signed)
Compensated without flare  Cont follow up with Dr. Marchelle Gearingamaswamy

## 2015-07-21 NOTE — Progress Notes (Signed)
PI note - d/w CRC who was in room with SUb-I  - fatigue is NEW - since last infusion. Moderate. She is driving a lot 1.6X3.5h for the infusions. She is planning to fly now. Possibly related to study drug but possibly related to travel itself  - constiplation, sore throat , sinus draiage - are al past hx  Dr. Kalman ShanMurali Meeya Goldin, M.D., [facility].P Pulmonary and Critical Care Medicine Staff Physician [facility] [place] [facility] and Critical Care Pager: [phone], If no answer or between  15:00h - 7:00h: call [phone]  07/21/2015 7:47 AM

## 2015-08-12 ENCOUNTER — Encounter (HOSPITAL_COMMUNITY): Payer: Self-pay

## 2015-08-12 ENCOUNTER — Encounter (HOSPITAL_COMMUNITY)
Admission: RE | Admit: 2015-08-12 | Discharge: 2015-08-12 | Disposition: A | Discharge status: Home / Self Care | Payer: Medicare Other | Source: Ambulatory Visit | Attending: Internal Medicine | Admitting: Internal Medicine

## 2015-08-12 ENCOUNTER — Other Ambulatory Visit: Payer: Self-pay | Admitting: Internal Medicine

## 2015-08-12 DIAGNOSIS — Z006 Encounter for examination for normal comparison and control in clinical research program: Secondary | ICD-10-CM | POA: Insufficient documentation

## 2015-08-12 DIAGNOSIS — J84112 Idiopathic pulmonary fibrosis: Secondary | ICD-10-CM

## 2015-08-12 MED ORDER — SODIUM CHLORIDE 0.9 % IV SOLN
125.0000 mL/h | Freq: Once | INTRAVENOUS | Status: AC
  Administered 2015-08-12: 500 mL/h via INTRAVENOUS
  Filled 2015-08-12: qty 180.3

## 2015-08-12 NOTE — Research (Signed)
[facility] (ClinicalTrials.gov Identifier: [id number])  RESEARCH SUBJECT. Phase 2, randomized, double-blind, placebo-controlled study to evaluate the safety and tolerability of FG-3019 in subjects with IPF, and the efficacy of FG-3019 in slowing the loss of forced vital capacity (FVC) and the progression of IPF in these subjects. FG-3019 is a human recombinant monoclonal antibody that targets connective tissue growth factor (CTGF), which is a key factor in the pathogenesis of fibrosis. The ability of FG-3019 to block CTGF makes it a candidate for treating IPF.[facility]AISE is sponsored by [employer]  [employer] is a [employer] company located in [place], [place]. FG-3019 is administered as an IV infusion every three weeks .    Inclusion Criteria: IPF for </= 5 years, age 78-80 and FVC is >55% and your DLCO >/30% and body weight < 130kg  Key other data: side effects with infusion - Cough > 30%, Fatigue > 20%, Dyspnea > 20%, URI > 15%, Headache > 15%, Diarrhea > 15%. Rare is flushing, arm pain, back pain and hypertension. SEverity 75% is miild-moderate. > 74% have atleast 1 side effect over time. So far none discontinued or died during treatment,. No overall difference between placebo and drug   This visit 08/12/2015  for Monica Matthews with [birth year] who is Subject Number [id number] is a Research Visit and is for purpose of Week 27 on PROTOCOL.  Patient present today for week 27 infusion of study drug/placebo.  Infusion completed outside of window, due to travel constraints.  Patient reports no changes in health or medications.  Patient feeling well and in good spirits due to recently received good [facility].    This RN initiated infusion, patient tolerating well.  Bedside RN staff will continue administration monitoring of patient for duration of infusion and observation period.  Kinnie FeilStacey Emily Massar, RN

## 2015-08-30 ENCOUNTER — Ambulatory Visit (INDEPENDENT_AMBULATORY_CARE_PROVIDER_SITE_OTHER)
Admission: RE | Admit: 2015-08-30 | Discharge: 2015-08-30 | Disposition: A | Discharge status: Home / Self Care | Payer: Self-pay | Source: Ambulatory Visit | Attending: Internal Medicine | Admitting: Internal Medicine

## 2015-08-30 ENCOUNTER — Encounter (HOSPITAL_COMMUNITY)
Admission: RE | Admit: 2015-08-30 | Discharge: 2015-08-30 | Disposition: A | Discharge status: Home / Self Care | Payer: Medicare Other | Source: Ambulatory Visit | Attending: Internal Medicine | Admitting: Internal Medicine

## 2015-08-30 ENCOUNTER — Encounter: Payer: Self-pay | Admitting: Adult Health

## 2015-08-30 ENCOUNTER — Ambulatory Visit (INDEPENDENT_AMBULATORY_CARE_PROVIDER_SITE_OTHER): Payer: Medicare Other | Admitting: Adult Health

## 2015-08-30 DIAGNOSIS — J84112 Idiopathic pulmonary fibrosis: Secondary | ICD-10-CM

## 2015-08-30 DIAGNOSIS — Z006 Encounter for examination for normal comparison and control in clinical research program: Secondary | ICD-10-CM

## 2015-08-30 MED ORDER — SODIUM CHLORIDE 0.9 % IV SOLN
125.0000 mL/h | Freq: Once | INTRAVENOUS | Status: AC
  Administered 2015-08-30: 500 mL/h via INTRAVENOUS
  Filled 2015-08-30: qty 180.3

## 2015-08-30 NOTE — Progress Notes (Signed)
[facility] (ClinicalTrials.gov Identifier: [id number])  RESEARCH SUBJECT. Phase 2, randomized, double-blind, placebo-controlled study to evaluate the safety and tolerability of FG-3019 in subjects with IPF, and the efficacy of FG-3019 in slowing the loss of forced vital capacity (FVC) and the progression of IPF in these subjects. FG-3019 is a human recombinant monoclonal antibody that targets connective tissue growth factor (CTGF), which is a key factor in the pathogenesis of fibrosis. The ability of FG-3019 to block CTGF makes it a candidate for treating IPF.[facility]AISE is sponsored by [employer].  [employer] is a [employer] located in [place], [place]. FG-3019 is administered as an IV infusion every three weeks .    Inclusion Criteria: IPF for </= 5 years, age 84-80 and FVC is >55% and your DLCO >/30% and body weight < 130kg  Key other data: side effects with infusion - Cough > 30%, Fatigue > 20%, Dyspnea > 20%, URI > 15%, Headache > 15%, Diarrhea > 15%. Rare is flushing, arm pain, back pain and hypertension. SEverity 75% is miild-moderate. > 74% have atleast 1 side effect over time. So far none discontinued or died during treatment,. No overall difference between placebo and drug   This visit 08/30/2015  for Monica Matthews with [birth year] who is Subject Number [id number]  is a research Visit and is for purpose of treatment and is Week 30 on protocol.  She is currently finishing up Cipro  Bid for a UTI.  This will be recorded as an AE.  No other problems at this time or changes to her meds.  After infusion, she will have her week 24 HRCT as the machine has been certified.

## 2015-08-30 NOTE — Research (Signed)
Patient infusion and observation period complete.  This RN checked on patient mid-infusion and patient resting comfortably, no further discomfort at IV site.  Patient tolerated infusion well, no complaints.  Patient going to HRCT appointment at this time.  Kinnie Feil, RN

## 2015-08-30 NOTE — Assessment & Plan Note (Signed)
Appears to be doing well on study drug with no apparent complications Cont w/ study drug protocol

## 2015-08-30 NOTE — Progress Notes (Signed)
reiviwed SUb-I note. Ok for infusion as planned  Dr. Kalman Shan, M.D., [facility].C.P Pulmonary and Critical Care Medicine Staff Physician [facility]monary and Critical Care Pager: [phone], If no answer or between  15:00h - 7:00h: call [phone]  08/30/2015 12:12 PM

## 2015-08-30 NOTE — Assessment & Plan Note (Signed)
Appears stable without flare  Cont current regimen

## 2015-08-30 NOTE — Patient Instructions (Signed)
Continue with research protocol.  

## 2015-08-30 NOTE — Research (Signed)
[facility] (ClinicalTrials.gov Identifier: [id number])  RESEARCH SUBJECT. Phase 2, randomized, double-blind, placebo-controlled study to evaluate the safety and tolerability of FG-3019 in subjects with IPF, and the efficacy of FG-3019 in slowing the loss of forced vital capacity (FVC) and the progression of IPF in these subjects. FG-3019 is a human recombinant monoclonal antibody that targets connective tissue growth factor (CTGF), which is a key factor in the pathogenesis of fibrosis. The ability of FG-3019 to block CTGF makes it a candidate for treating IPF.[facility] is sponsored by [employer].  [employer] is a [employer] located in [place], [place]. FG-3019 is administered as an IV infusion every three weeks .    Inclusion Criteria: IPF for </= 5 years, age 49-80 and FVC is >55% and your DLCO >/30% and body weight < 130kg  Key other data: side effects with infusion - Cough > 30%, Fatigue > 20%, Dyspnea > 20%, URI > 15%, Headache > 15%, Diarrhea > 15%. Rare is flushing, arm pain, back pain and hypertension. SEverity 75% is miild-moderate. > 74% have atleast 1 side effect over time. So far none discontinued or died during treatment,. No overall difference between placebo and drug   This visit 08/30/2015  for Monica Matthews with 1936-04-19 who is Subject Number [id number]  is a Research Visit and is for purpose of Week 30 Infusion on PROTOCOL.  Patient is at hospital for Week 30 infusion.  Patient reports no changes in health/medications beyond what was already reported to coordinator and NP earlier this visit.  Infusion to be administered by bedside RN staff and monitored during infusion and observation period by the same.  This RN initiation infusion of study drug/placebo.  Patient mentioned that IV is "uncomfortable" and experiencing a mild burning; patient states IV (in [facility]) was uncomfortable and burning prior to start of infusion.  No redness or irritation at IV site noted by this RN, no signs  of infiltration.  Medication infusing well.  Bedside RN to check on IV again in a few minutes.  Kinnie Feil, RN

## 2015-08-30 NOTE — Progress Notes (Signed)
   Subjective:    Patient ID: Brita Jurgensen, female    DOB: 04/25/1936, 80 y.o.   MRN: [id number]  HPI    Review of Systems     Objective:   Physical Exam        Assessment & Plan:    Subjective:    Patient ID: Lasha Echeverria, female    DOB: [birth year], 80 y.o.   MRN: [id number]  HPI [facility] (ClinicalTrials.gov Identifier: [id number]) RESEARCH SUBJECT. Phase 2, randomized, double-blind, placebo-controlled study to evaluate the safety and tolerability of FG-3019 in subjects with IPF, and the efficacy of FG-3019 in slowing the loss of forced vital capacity (FVC) and the progression of IPF in these subjects. FG-3019 is a human recombinant monoclonal antibody that targets connective tissue growth factor (CTGF), which is a key factor in the pathogenesis of fibrosis. The ability of FG-3019 to block CTGF makes it a candidate for treating IPF.PRAISE is sponsored by [employer]. [employer] is a [employer] located in [place], [place]. FG-3019 is administered as an IV infusion every three weeks .  Inclusion Criteria: IPF for </= 5 years, age 13-80 and FVC is >55% and your DLCO >/30% and body weight < 130kg   Key other data: side effects with infusion - Cough > 30%, Fatigue > 20%, Dyspnea > 20%, URI > 15%, Headache > 15%, Diarrhea > 15%. Rare is flushing, arm pain, back pain and hypertension. SEverity 75% is miild-moderate. > 74% have atleast 1 side effect over time. So far none discontinued or died during treatment,. No overall difference between placebo and drug   08/30/2015  Research visit :Week 30  Pt returns for research visit .  Says she is doing well overall. Denies questions or concerns.   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  Had a recent UTI , on last day of abx. . She denies chest pain, hemoptysis , orthopnea or edema.   Review of Systems See HPI          Objective:   Physical Exam  GEN: A/Ox3; pleasant , NAD,  well nourished , appears younger than stated age   HEENT:  [place]/AT,  EACs-clear,, NOSE-clear, THROAT-clear, no lesions, no postnasal drip or exudate noted.   NECK:  Supple w/ fair ROM; no JVD; normal carotid impulses w/o bruits; no thyromegaly or nodules palpated; no lymphadenopathy.  RESP  Bibasilar crackles w/ no wheezing .no accessory muscle use, no dullness to percussion  CARD:  RRR, no m/r/g  , no peripheral edema, pulses intact, no cyanosis or clubbing.  GI:   Soft & nt;   Musco: Warm bil, no deformities or joint swelling noted.   Neuro: alert, no focal deficits noted.    Skin: Warm, no lesions or rashes '       Assessment & Plan:

## 2015-09-20 ENCOUNTER — Ambulatory Visit (HOSPITAL_COMMUNITY)
Admission: RE | Admit: 2015-09-20 | Discharge: 2015-09-20 | Disposition: A | Discharge status: Home / Self Care | Payer: Medicare Other | Source: Ambulatory Visit | Attending: Internal Medicine | Admitting: Internal Medicine

## 2015-09-20 ENCOUNTER — Ambulatory Visit (INDEPENDENT_AMBULATORY_CARE_PROVIDER_SITE_OTHER): Payer: Medicare Other | Admitting: Internal Medicine

## 2015-09-20 DIAGNOSIS — Z006 Encounter for examination for normal comparison and control in clinical research program: Secondary | ICD-10-CM

## 2015-09-20 DIAGNOSIS — J84112 Idiopathic pulmonary fibrosis: Secondary | ICD-10-CM

## 2015-09-20 MED ORDER — STUDY - INVESTIGATIONAL DRUG SIMPLE RECORD (ML)
125.0000 mL/h | Freq: Once | Status: AC
  Administered 2015-09-20: 125 mL/h via INTRAVENOUS
  Filled 2015-09-20: qty 178.8

## 2015-09-20 NOTE — Progress Notes (Signed)
[facility] (ClinicalTrials.gov Identifier: [id number])  RESEARCH SUBJECT. Phase 2, randomized, double-blind, placebo-controlled study to evaluate the safety and tolerability of FG-3019 in subjects with IPF, and the efficacy of FG-3019 in slowing the loss of forced vital capacity (FVC) and the progression of IPF in these subjects. FG-3019 is a human recombinant monoclonal antibody that targets connective tissue growth factor (CTGF), which is a key factor in the pathogenesis of fibrosis. The ability of FG-3019 to block CTGF makes it a candidate for treating IPF.PRAISE is sponsored by [employer].  [employer] is a [employer] located in [place], [place]. FG-3019 is administered as an IV infusion every three weeks .    Inclusion Criteria: IPF for </= 5 years, age 44-80 and FVC is >55% and your DLCO >/30% and body weight < 130kg  Key other data: side effects with infusion - Cough > 30%, Fatigue > 20%, Dyspnea > 20%, URI > 15%, Headache > 15%, Diarrhea > 15%. Rare is flushing, arm pain, back pain and hypertension. SEverity 75% is miild-moderate. > 74% have atleast 1 side effect over time.  No overall difference between placebo and drug   This visit 09/20/2015  for Arlyss Oleson with 05/03/1936 who is Subject Number [id number] is a Research Visit and is for purpose of Week 33 Infusion on PROTOCOL.  Patient present today for infusion of study drug/placebo.  Patient reports being well, no changes in health or medications.  This RN initiated infusion and patient tolerating well. Patient will continue to be monitored by bedside nursing staff for duration of infusion and observation period.  This RN again saw patient after completion of infusion.  Patient tolerated well, no complaints.  Next visit two visits scheduled with patient.  Patient informed that updated informed consent form should be available and will be provided at next visit, Week 36.  Coordinator Beatriz Stallion to be in touch with patient to  help arrange transportation between offices at next visit.  Collected map, hotel receipt from this visit, and plane ticket information from prior visit (given to [facility]).  This RN provided study training to new RN staff member, Arville Care.  Shawna Orleans also given badge buddy with contact information.  She demonstrated understanding of study and requirements.  Kinnie Feil, RN

## 2015-10-11 ENCOUNTER — Ambulatory Visit (HOSPITAL_COMMUNITY)
Admission: RE | Admit: 2015-10-11 | Discharge: 2015-10-11 | Disposition: A | Discharge status: Home / Self Care | Payer: Medicare Other | Source: Ambulatory Visit | Attending: Internal Medicine | Admitting: Internal Medicine

## 2015-10-11 ENCOUNTER — Ambulatory Visit (INDEPENDENT_AMBULATORY_CARE_PROVIDER_SITE_OTHER): Payer: Medicare Other | Admitting: Internal Medicine

## 2015-10-11 ENCOUNTER — Encounter: Payer: Self-pay | Admitting: Adult Health

## 2015-10-11 ENCOUNTER — Ambulatory Visit (INDEPENDENT_AMBULATORY_CARE_PROVIDER_SITE_OTHER): Payer: Medicare Other | Admitting: Adult Health

## 2015-10-11 VITALS — Wt 135.0 lb

## 2015-10-11 DIAGNOSIS — Z006 Encounter for examination for normal comparison and control in clinical research program: Secondary | ICD-10-CM

## 2015-10-11 DIAGNOSIS — J84112 Idiopathic pulmonary fibrosis: Secondary | ICD-10-CM

## 2015-10-11 MED ORDER — STUDY - INVESTIGATIONAL DRUG SIMPLE RECORD (ML)
125.0000 mL/h | Freq: Once | Status: AC
  Administered 2015-10-11: 500 mL/h via INTRAVENOUS
  Filled 2015-10-11: qty 184.2

## 2015-10-11 NOTE — Assessment & Plan Note (Signed)
Appears stable , recent infection recorded for study log.  Cont w/ research protocol

## 2015-10-11 NOTE — Patient Instructions (Signed)
Continue with research protocol.  

## 2015-10-11 NOTE — Assessment & Plan Note (Signed)
Recent flare with URI , appears to be improving with abx therapy .  Cont current regimen .

## 2015-10-11 NOTE — Progress Notes (Signed)
[facility] (ClinicalTrials.gov Identifier: [id number])  RESEARCH SUBJECT. Phase 2, randomized, double-blind, placebo-controlled study to evaluate the safety and tolerability of FG-3019 in subjects with IPF, and the efficacy of FG-3019 in slowing the loss of forced vital capacity (FVC) and the progression of IPF in these subjects. FG-3019 is a human recombinant monoclonal antibody that targets connective tissue growth factor (CTGF), which is a key factor in the pathogenesis of fibrosis. The ability of FG-3019 to block CTGF makes it a candidate for treating IPF.[facility] is sponsored by [employer]  [employer] is a [employer] company located in [place]. FG-3019 is administered as an IV infusion every three weeks .    Inclusion Criteria: IPF for </= 5 years, age 78-80 and FVC is >55% and your DLCO >/30% and body weight < 130kg  Key other data: side effects with infusion - Cough > 30%, Fatigue > 20%, Dyspnea > 20%, URI > 15%, Headache > 15%, Diarrhea > 15%. Rare is flushing, arm pain, back pain and hypertension. SEverity 75% is miild-moderate. > 74% have atleast 1 side effect over time.  No overall difference between placebo and drug   This visit 10/11/2015  for Monica Matthews with 02-22-1936 who is Subject Number [id number] is a Research Visit and is for purpose of Week 36 infusion on PROTOCOL.  Patient present at hospital today for Week 36 infusion of study drug/placebo.  Patient reports change in health, previously communicated this visit with Sub-I, NP [name] Parrett (see her documentation).  Otherwise no changes.  Week 36 infusion initiated by this RN, patient tolerated well.  Patient provided this RN with receipt of hotel stay for this visit, as well as map of travel.  Additionally patient provided this RN with a bill, believed by patient to be sent to her in error.  Patient (or assistant) to communicate cost/distance of flights for this visit directly to coordinator Beatriz Stallionarolyn Hedrick.    Patient  again see by this RN at end of observation period, patient has no complaints, no changes.  Week 39 infusion already scheduled.  Patient requests she be contacted soon for scheduling of Week 42 visit.  I requested coordinator Beatriz StallionCarolyn Hedrick to follow up with patient for scheduling Week 42.  Monica FeilStacey Leimomi Zervas, RN

## 2015-10-11 NOTE — Progress Notes (Signed)
     Subjective:    Patient ID: Garnette ScheuermannRose Scholze, female    DOB: 12/28/1935, 80 y.o.   MRN: [id number]  HPI [facility] (ClinicalTrials.gov Identifier: [id number]) RESEARCH SUBJECT. Phase 2, randomized, double-blind, placebo-controlled study to evaluate the safety and tolerability of FG-3019 in subjects with IPF, and the efficacy of FG-3019 in slowing the loss of forced vital capacity (FVC) and the progression of IPF in these subjects. FG-3019 is a human recombinant monoclonal antibody that targets connective tissue growth factor (CTGF), which is a key factor in the pathogenesis of fibrosis. The ability of FG-3019 to block CTGF makes it a candidate for treating IPF.[facility] is sponsored by [employer] is a [employer] company located in [place]. FG-3019 is administered as an IV infusion every three weeks .  Inclusion Criteria: IPF for </= 5 years, age 78-78 and FVC is >55% and your DLCO >/30% and body weight < 130kg   Key other data: side effects with infusion - Cough > 30%, Fatigue > 20%, Dyspnea > 20%, URI > 15%, Headache > 15%, Diarrhea > 15%. Rare is flushing, arm pain, back pain and hypertension. SEverity 75% is miild-moderate. > 74% have atleast 1 side effect over time. So far none discontinued or died during treatment,. No overall difference between placebo and drug   10/11/2015  Research visit :Week 36 Pt returns for research visit .  Says cough has increased with associated sore throat, layngitis Nathaniel Man/hoarseness . Seen by local MD with tx with Levaquin for 10 d. Says she is doing better w/ decreased cough. Has 3 days left on regimen.  Denies chest pain, orthopnea, edema, fever, or n/v//d.    Review of Systems See HPI          Objective:   Physical Exam  GEN: A/Ox3; pleasant , NAD, well nourished , appears younger than stated age   HEENT:  Bland/AT,  EACs-clear,, NOSE-clear, THROAT-clear, no lesions, no postnasal drip or exudate noted.   NECK:  Supple w/ fair ROM; no JVD;  normal carotid impulses w/o bruits; no thyromegaly or nodules palpated; no lymphadenopathy.  RESP  Bibasilar crackles w/ no wheezing .no accessory muscle use, no dullness to percussion  CARD:  RRR, no m/r/g  , no peripheral edema, pulses intact, no cyanosis or clubbing.  GI:   Soft & nt;   Musco: Warm bil, no deformities or joint swelling noted.   Neuro: alert, no focal deficits noted.    Skin: Warm, no lesions or rashes ' [name] Parrett NP-C  [facility]  10/11/2015        Assessment & Plan:

## 2015-10-11 NOTE — Progress Notes (Signed)
[facility] (ClinicalTrials.gov Identifier: [id number])  RESEARCH SUBJECT. Phase 2, randomized, double-blind, placebo-controlled study to evaluate the safety and tolerability of FG-3019 in subjects with IPF, and the efficacy of FG-3019 in slowing the loss of forced vital capacity (FVC) and the progression of IPF in these subjects. FG-3019 is a human recombinant monoclonal antibody that targets connective tissue growth factor (CTGF), which is a key factor in the pathogenesis of fibrosis. The ability of FG-3019 to block CTGF makes it a candidate for treating IPF.[facility] is sponsored by [employer]  [employer] is a [employer] company located in [place]. FG-3019 is administered as an IV infusion every three weeks .    Inclusion Criteria: IPF for </= 5 years, age 78-80 and FVC is >55% and your DLCO >/30% and body weight < 130kg  Key other data: side effects with infusion - Cough > 30%, Fatigue > 20%, Dyspnea > 20%, URI > 15%, Headache > 15%, Diarrhea > 15%. Rare is flushing, arm pain, back pain and hypertension. SEverity 75% is miild-moderate. > 74% have atleast 1 side effect over time.  No overall difference between placebo and drug   This visit 10/11/2015  for Monica Matthews with Sep 04, 1935 who is Subject Number [id number]  is a research  Visit and is for purpose of treatment and is week 36 on PROTOCOL.  Subject returns for treatment.  She has no new complaints but does report a cough which began Feb 26,2017, she went to a local MD and he prescribed Levaquin 750mg  for 10 days.  She has 3 days left.  Otherwise she is doing well.  She will [place] from [place] the first week in April.  We also updated a new consent.  She was given the chance to ask questions, which she had none.  Should be noted, we are still awaiting the updated ICF for travel for this subject.  A copy of the ICF was given to the subject.  [facility].

## 2015-10-18 ENCOUNTER — Inpatient Hospital Stay (HOSPITAL_COMMUNITY): Admission: RE | Admit: 2015-10-18 | Payer: Medicare Other | Source: Ambulatory Visit

## 2015-11-01 ENCOUNTER — Ambulatory Visit (INDEPENDENT_AMBULATORY_CARE_PROVIDER_SITE_OTHER): Payer: Medicare Other | Admitting: Internal Medicine

## 2015-11-01 ENCOUNTER — Encounter (HOSPITAL_COMMUNITY)
Admission: RE | Admit: 2015-11-01 | Discharge: 2015-11-01 | Disposition: A | Discharge status: Home / Self Care | Payer: Medicare Other | Source: Ambulatory Visit | Attending: Internal Medicine | Admitting: Internal Medicine

## 2015-11-01 DIAGNOSIS — Z006 Encounter for examination for normal comparison and control in clinical research program: Secondary | ICD-10-CM | POA: Insufficient documentation

## 2015-11-01 DIAGNOSIS — J84112 Idiopathic pulmonary fibrosis: Secondary | ICD-10-CM | POA: Insufficient documentation

## 2015-11-01 MED ORDER — STUDY - INVESTIGATIONAL DRUG SIMPLE RECORD (ML)
500.0000 mL/h | Status: DC
  Administered 2015-11-01: 500 mL/h via INTRAVENOUS
  Filled 2015-11-01: qty 184.2

## 2015-11-01 NOTE — Progress Notes (Addendum)
[facility] (ClinicalTrials.gov Identifier: [id number])  RESEARCH SUBJECT. Phase 2, randomized, double-blind, placebo-controlled study to evaluate the safety and tolerability of FG-3019 in subjects with IPF, and the efficacy of FG-3019 in slowing the loss of forced vital capacity (FVC) and the progression of IPF in these subjects. FG-3019 is a human recombinant monoclonal antibody that targets connective tissue growth factor (CTGF), which is a key factor in the pathogenesis of fibrosis. The ability of FG-3019 to block CTGF makes it a candidate for treating IPF.[facility] is sponsored by [employer].  [employer] is a [employer] located in [place], [place]. FG-3019 is administered as an IV infusion every three weeks .    Inclusion Criteria: IPF for </= 5 years, age 65-80 and FVC is >55% and your DLCO >/30% and body weight < 130kg  Key other data: side effects with infusion - Cough > 30%, Fatigue > 20%, Dyspnea > 20%, URI > 15%, Headache > 15%, Diarrhea > 15%. Rare is flushing, arm pain, back pain and hypertension. SEverity 75% is miild-moderate. > 74% have atleast 1 side effect over time.  No overall difference between placebo and drug   This visit 11/01/2015  for Monica Matthews with 1936/03/14 who is Subject Number [id number]  is a Research Visit and is for purpose of Week 39 Infusion on PROTOCOL.  Patient present at [facility]; today for Week 39 infusion of study drug/placebo.  Week 42 appointment already scheduled.  Patient cough, productive with sputum.  Patient reports cough began about 1 week ago, and she saw MD in [place] who prescribed a 7 day course of amoxicillin   for infection in bronchioles.  Patient began taking antibiotics five days ago (10/27/15).  This RN relayed information to Bari Edward, MD who confirmed okay to continue with infusion.  Patient confirmed that she was not prescribed any steroids.  This RN relayed to patient from Dr. Marchelle Gearing, that if the cough lingers or if she were  to feel worse that she may need a prednisone prescription.  Patient demonstrated understanding and indicated that she was feeling better with antibiotics so she foes not think a steroid prescription is needed at this time.  No other changes in health or medications.  Patient also provided with updated copy of informed consent form by this RN.  Study Coordinator Beatriz Stallion to come to visit to review all changes with patient prior to end of visit.  See Eber Jones Hedrick's documentation on informed consent process.  Infusion initiated by this RN per protocol.  Patient tolerated infusion well.  Patient will continue to be monitored by bedside nursing staff for duration of observation period.    10:30 - Patient seen by this RN at end of observation period.  No changes.  Patient tolerated well.  Copy of signed informed consent form provided to patient.    Kinnie Feil, RN    Subject was reconsented using the lastest version which addressed the travel language for flight travel.  A discussion was had prior to the subject signing the consent and she is in agreement with the terms.  The subject voluntarily signed today 27Mar2017.  A copy of the consent has been placed in the subjects chart to give to her at her next visit.   PI OVersight note   - took call from [facility] about subject visit. Noted AE of acute bronchitis. SHe declined sterpids. Mild severity. Improving. Unrelated to study drug. Reconsent noted. Instructed to proceed with infusion and post infusion notes reviewed   Dr. Kalman Shan,  M.D., F.C.C.P Pulmonary and Critical Care Medicine Staff Physician [facility] Pager: [phone], If no answer or between  15:00h - 7:00h: call [phone]  11/03/2015 6:15 AM

## 2015-11-22 ENCOUNTER — Encounter: Payer: Self-pay | Admitting: Adult Health

## 2015-11-22 ENCOUNTER — Ambulatory Visit (INDEPENDENT_AMBULATORY_CARE_PROVIDER_SITE_OTHER): Payer: Medicare Other | Admitting: Adult Health

## 2015-11-22 ENCOUNTER — Ambulatory Visit (INDEPENDENT_AMBULATORY_CARE_PROVIDER_SITE_OTHER): Payer: Medicare Other | Admitting: Internal Medicine

## 2015-11-22 ENCOUNTER — Other Ambulatory Visit: Payer: Medicare Other

## 2015-11-22 ENCOUNTER — Encounter (HOSPITAL_COMMUNITY)
Admission: RE | Admit: 2015-11-22 | Discharge: 2015-11-22 | Disposition: A | Discharge status: Home / Self Care | Payer: Medicare Other | Source: Ambulatory Visit | Attending: Internal Medicine | Admitting: Internal Medicine

## 2015-11-22 VITALS — BP 110/76 | HR 66 | Temp 98.1°F | Resp 16 | Wt 133.0 lb

## 2015-11-22 VITALS — BP 110/70 | HR 66 | Temp 98.1°F | Resp 16 | Wt 133.0 lb

## 2015-11-22 DIAGNOSIS — Z006 Encounter for examination for normal comparison and control in clinical research program: Secondary | ICD-10-CM | POA: Insufficient documentation

## 2015-11-22 DIAGNOSIS — J84112 Idiopathic pulmonary fibrosis: Secondary | ICD-10-CM

## 2015-11-22 MED ORDER — STUDY - INVESTIGATIONAL DRUG SIMPLE RECORD (ML)
125.0000 mL/h | Freq: Once | Status: AC
  Administered 2015-11-22: 500 mL/h via INTRAVENOUS
  Filled 2015-11-22: qty 184.2

## 2015-11-22 MED ORDER — STUDY - INVESTIGATIONAL DRUG SIMPLE RECORD (ML)
125.0000 mL/h | Freq: Once | Status: DC
  Filled 2015-11-22: qty 184.2

## 2015-11-22 NOTE — Progress Notes (Signed)
[facility] (ClinicalTrials.gov Identifier: [id number])  RESEARCH SUBJECT. Phase 2, randomized, double-blind, placebo-controlled study to evaluate the safety and tolerability of FG-3019 in subjects with IPF, and the efficacy of FG-3019 in slowing the loss of forced vital capacity (FVC) and the progression of IPF in these subjects. FG-3019 is a human recombinant monoclonal antibody that targets connective tissue growth factor (CTGF), which is a key factor in the pathogenesis of fibrosis. The ability of FG-3019 to block CTGF makes it a candidate for treating IPF.[facility] is sponsored by [employer]  [employer] is a [employer] company located in [place], [place]. FG-3019 is administered as an IV infusion every three weeks .    Inclusion Criteria: IPF for </= 5 years, age 78-80 and FVC is >55% and your DLCO >/30% and body weight < 130kg  Key other data: side effects with infusion - Cough > 30%, Fatigue > 20%, Dyspnea > 20%, URI > 15%, Headache > 15%, Diarrhea > 15%. Rare is flushing, arm pain, back pain and hypertension. SEverity 75% is miild-moderate. > 74% have atleast 1 side effect over time.  No overall difference between placebo and drug   This visit 11/22/2015  for Monica Matthews with 04-16-1936 who is Subject Number [id number]  is a Research Visit and is for purpose of Week 42 on PROTOCOL.  Patient at hospital this afternoon for Week 42 infusion.  Patient seen earlier by coordinator Beatriz Stallionarolyn Hedrick, see that documentation for notes on AEs.  Patient described no new adverse events or con meds to this RN.  Patient stated that she is feeling well, lingering mild "frog in throat" from recent cough/respiratory infection (previously reported).  This RN initiated infusion.  Patient tolerating well.  Week 45 infusion visit already scheduled.  Provided appointment reminder card to patient.  Reviewed plan for upcoming weeks on protocol: date/time of Week 45 infusion confirmed and confirmed that Week 45 will be  last infusion before Week 48 appointment.  Week 48 visit will be visit to determine if patient continues into Extension phase of study.  Informed patient that coordinators will work with Sponsor to determine process for Week 48 to learn more specifics on visit timing and will update subject as able. Patient expressed hope to continue into extension, demonstrated understanding of visit schedule and plan.  Patient to continue to be monitored by bedside staff for duration of 30minute infusion and 30minute observation period.  Kinnie FeilStacey Phelps, RN    Patient seen by this RN at end of observation period.  Patient tolerated infusion well, no complaints.  Patient ready to return home following infusion.  Spouse with patient when seen at end of observation period.  Again confirmed date of next infusion appointment with spouse and patient.  Kinnie FeilStacey Phelps, RN

## 2015-11-22 NOTE — Progress Notes (Signed)
°  [facility] (ClinicalTrials.gov Identifier: [id number])  RESEARCH SUBJECT. Phase 2, randomized, double-blind, placebo-controlled study to evaluate the safety and tolerability of FG-3019 in subjects with IPF, and the efficacy of FG-3019 in slowing the loss of forced vital capacity (FVC) and the progression of IPF in these subjects. FG-3019 is a human recombinant monoclonal antibody that targets connective tissue growth factor (CTGF), which is a key factor in the pathogenesis of fibrosis. The ability of FG-3019 to block CTGF makes it a candidate for treating IPF.[facility] is sponsored by [employer]  [employer] is a [employer] company located in [place], [place]. FG-3019 is administered as an IV infusion every three weeks .    Inclusion Criteria: IPF for </= 5 years, age 78-80 and FVC is >55% and your DLCO >/30% and body weight < 130kg  Key other data: side effects with infusion - Cough > 30%, Fatigue > 20%, Dyspnea > 20%, URI > 15%, Headache > 15%, Diarrhea > 15%. Rare is flushing, arm pain, back pain and hypertension. SEverity 75% is miild-moderate. > 74% have atleast 1 side effect over time.  No overall difference between placebo and drug   This visit 11/22/2015  for Monica Matthews with July 14, 1936 who is Subject Number [id number] is a  Research Visit and is for purpose of treatment and is number on PROTOCOL    Subject seen by Rubye Oaksammy Parrett, NP for week 42 of treatment.  Subject reports, excessive wax in left ear.  This has been examined by Sub I and determined there is no active infection or treatment needed. Patient reports this is a chronic problem going on since November 2016.  Subject also reports a pain under breasts, again examined by Sub I and no treatment needed at this time.  Subject reports she has an appointment tomorrow 18Apr2017 with her primary MD for a check up and will have him examine this area of concern for her.  Discussed possibility of subject continuing on into the extension study and explained  we would have more information after speaking with sponsor.  Husband would like to know if she is receiving study drug at this time.  Subject will head to hospital for IV infusion treatment.

## 2015-12-13 ENCOUNTER — Ambulatory Visit (INDEPENDENT_AMBULATORY_CARE_PROVIDER_SITE_OTHER): Payer: Medicare Other | Admitting: Internal Medicine

## 2015-12-13 ENCOUNTER — Encounter (HOSPITAL_COMMUNITY)
Admission: RE | Admit: 2015-12-13 | Discharge: 2015-12-13 | Disposition: A | Discharge status: Home / Self Care | Payer: Medicare Other | Source: Ambulatory Visit | Attending: Internal Medicine | Admitting: Internal Medicine

## 2015-12-13 DIAGNOSIS — Z006 Encounter for examination for normal comparison and control in clinical research program: Secondary | ICD-10-CM | POA: Insufficient documentation

## 2015-12-13 DIAGNOSIS — J84112 Idiopathic pulmonary fibrosis: Secondary | ICD-10-CM

## 2015-12-13 MED ORDER — STUDY - INVESTIGATIONAL DRUG SIMPLE RECORD (ML)
500.0000 mL/h | Status: DC
  Administered 2015-12-13: 500 mL/h via INTRAVENOUS
  Filled 2015-12-13: qty 184.2

## 2015-12-13 NOTE — Progress Notes (Signed)
[facility] (ClinicalTrials.gov Identifier: [id number])  RESEARCH SUBJECT. Phase 2, randomized, double-blind, placebo-controlled study to evaluate the safety and tolerability of FG-3019 in subjects with IPF, and the efficacy of FG-3019 in slowing the loss of forced vital capacity (FVC) and the progression of IPF in these subjects. FG-3019 is a human recombinant monoclonal antibody that targets connective tissue growth factor (CTGF), which is a key factor in the pathogenesis of fibrosis. The ability of FG-3019 to block CTGF makes it a candidate for treating IPF.[facility] is sponsored by [employer]  [employer] is a [employer] company located in [place]. FG-3019 is administered as an IV infusion every three weeks .    Inclusion Criteria: IPF for </= 5 years, age 78-80 and FVC is >55% and your DLCO >/30% and body weight < 130kg  Key other data: side effects with infusion - Cough > 30%, Fatigue > 20%, Dyspnea > 20%, URI > 15%, Headache > 15%, Diarrhea > 15%. Rare is flushing, arm pain, back pain and hypertension. SEverity 75% is miild-moderate. > 74% have atleast 1 side effect over time.  No overall difference between placebo and drug ..................................................................................................................  This visit 12/13/2015  for Monica Matthews with 1936/05/16 who is Subject Number [id number] is a Research Visit and is for purpose of Week 45 infusion on PROTOCOL.  Patient present at hospital today for Week 45 infusion.  Spouse has accompanied patient.  Patient experiencing seasonal allergies symptoms of mild cough, post nasal drip and watery eyes.  This is common for her each Spring, medical history.  Patient also reports that she had a bout of "food poisoning" recently.  After eating at a restaurant on evening of 11/26/15, grilled grouper and grilled vegetables, she began to feel ill and experienced ongoing diarrhea starting that evening.  Patient reports  that she only ate a few bites of the fish because she thought it tasted odd.  She saw her GP (4/24) 2 days of diarrhea and was prescribed a 10 day course of an antidiarrheal agent, starting with a D.  Patient unsure of medication name, she will notify study staff of medication name.  She had tried OTC immodium for two days without success prior to seeing GP (4/22-23).  Patient has since completed this prescription.  Patient feeling well today.  Vital signs stable.  Reviewed with patient and spouse the plan for upcoming study weeks.  Week 48 visit will not have an infusion, but patient will complete tests, including lung function tests, at the Week 48 visit.  This information will then be submitted by study staff to Sponsor for review and determination of extension eligibility.  Notification of extension eligibility may take 3 weeks.  Local study staff will contact patient as we learn the status.  Reviewed with patient that eligibility in extension is not guaranteed.  Consent form reviews reasons for discontinuation, also eligibility for extension must be reviewed and approved by Sponsor.  Patient demonstrated understanding.    Infusion of study drug/placebo initiated by this RN.  Patient to continue to be monitored by bedside staff for duration of infusion and observation period.  Patient tolerating well.  At about 25 minutes into infusion, research RN noted that inline filter had not been added to infusion set up.  Research RN added filter for remaining infusion.  VSS; patient has been alert, oriented and mentating appropriately throughout infusion.  Beside RN made aware for observation purposes.  PI made aware, and per his request, this RN contacted Pharmacist Nadara Mustardita Johnston (who advised  likely no additional monitoring needed, patients on medications such as immunoglobulin requiring filters, would experience a reaction quickly if no filter; unsure why this study drug requires filter) as well as CRA Vernard Gambles.  Vernard Gambles contacted Medical Monitor and returned call to this Rn with the following message: filter is to protect against any particles that may be in infusion because study drug is stored in vials; recommendation from Medical Monitor is to keep patient for full 1 hour observation period.  PI notified of communications with pharmacist and CRA.  Patient to stay for 1 hour observation period, no additional orders given.  Protocol deviation to be reported.  Patient and her spouse made aware that filer was not in line for duration of infusion.  Patient and spouse, appreciated that filter added when noticed was not in line. Patient and spouse made aware of 1 hour observation period.  Agreed, but ready to return home.  Patient states she feels well.  1 hour post infusion vitals, blood pressure systolic was 163 (right arm), 161 (left arm).  PI made aware of BP, patient asymptomatic. PI ordered for patient to stay an additional 30 minutes to monitor BP.  Patient and spouse willing to stay an additional 30 minutes.  PI made aware at 1526 of last set of vital signs taken.  Patient continues to be asymptomatic, feels well.  PI determined patient okay to discharge.  Patient demonstrated understanding that if any changed and she needed medical care to do so and then notify research staff.  Spouse also demonstrated understanding of this.      Kinnie Feil, RN Research Nurse Office [phone]

## 2015-12-14 ENCOUNTER — Telehealth: Payer: Self-pay | Admitting: Internal Medicine

## 2015-12-14 NOTE — Telephone Encounter (Signed)
To Vernard Gamblesarolyn Coleman, Study Monitor for Sponsor:  The patient contacted me via email last night after 9pm asking for a rx of Amlodipine 5mg , apparently she had taken this back in April 2014 for an episode of elevated BP.  Shortly after initial e-mail, another was sent stating, she was going to the ER to be on the safe side, stated at its highest her blood pressure was 209/103 but was followed by lower readings under 200.  Spoke with her this am, she is fully recovered and was kept for observation and given a 5mg  Amlodipine.  Said her blood pressure dropped to 116/68 and was 141/74 at 1010 this morning.  She stated, she does not have a history of high blood pressure and had only taken the Amlodipine for a couple of days back in 2014.  She did not report any of this during her initial screening visit with us.  She is feeling much better and is planning to [place].  This was discussed with Dr. Marchelle Gearingamaswamy;  Misty StanleyStacey and I are following up with his instructions to, report this to sponsor, record an AE for hypertension, study related, a Note to File regarding a corrective action plan and updating her medication list.

## 2015-12-14 NOTE — Telephone Encounter (Signed)
Subject sent an email last pm at approx. 915 pm which stated, "Eber Jonesarolyn, please ask dr. Marchelle Gearingamaswamy to call in a  RX AMLODIPINE besylate.Kennyth Arnold. Stacy forgot to put the low protein binding in line filter on the infusion and my BP shot up substantially..we waited an extra hour to see if it would lower but it did not..when I got home approximately 7:50 PM I stared taking my BP.   First reading was 176/90 pulse 65, second reading was 192/89 pulse 63, third reading 197/92 pulse 61, fourth reading 209/103 pulse 67 fifth reading 185/98 pulse 65, six reading 193/92 pulse 63, seventh reading 192/92 pulse 63. I happen to have some leftover meds that  my physician at the time gave me for a slightly elevated BP. Never ever has it gone this high or as high as the last reading that Kennyth ArnoldStacy took which was in the 160s. I would like the Dr. to please call in the above mentioned medication to my pharmacy [facility] phone number [phone]. I just happen to have eight of these pills left from the script in April 2014.Marland Kitchen. as soon as I found my BP was that high I looked for this med in my medicine cabinet and thank God I found it I immediately took one pill.  Needless to say I am upset! Please get back to me ASAP and advise! [employer] [facility]  Spoke with subject this am at 1040, stated that [facility] and was observed due to her blood pressure.  Was given Amlodipine 5mg  while there and a written rx at discharge.  Per the subject, " My blood pressure went really high and it scared me, I have no problem with anything now, I feel much better.  The crisis is over."  Subject was aware the filter was not placed on IV tubing initially, this was explained to her by Kinnie FeilStacey Phelps, RN and again she has no problem with this.  Stated she would continue to take the Amlodipine for the next couple of days and then stop as she did in 2014.  I confirmed that she has not had a blood pressure issue or medication for this documented in her chart.  She  stated, she does not and only took Amlodipine for a "slight" elevation.  Subject stated at 1010 this am blood pressure was 141/74 with a pulse of 76.  When she first too Amlodipine at ER, it was 116/68.  Explained I would let Dr. Marchelle Gearingamaswamy know about her ER visit and the medication given.  [place] and is looking forward to seeing friends.

## 2015-12-14 NOTE — Telephone Encounter (Signed)
PI note  Subject feels that hypertension episode was related to infusion of investigational product due to lack of using Filter. She was asymptomatic at discharge and appears asymptomatic at home as well. This is an AE due to protocol deviation - seveity is mild and likely related to INvestigational product  Dr. Kalman ShanMurali Natsuko Kelsay, M.D., [facility] Pulmonary and Critical Care Medicine Staff Physician [facility] [place] Pulmonary and Critical Care Pager: [phone], If no answer or between  15:00h - 7:00h: call [phone]  12/14/2015 5:37 PM

## 2015-12-14 NOTE — Telephone Encounter (Signed)
PI oversight  - see my other phone note for comment. AE - mild - related to study drug. Protocol deviation due to lack of infusion filter. Rest per CRC who I discussed with face to face 12/14/2015  Dr. Kalman ShanMurali Monica Matthews, M.D., [facility].C.C.P Pulmonary and Critical Care Medicine Staff Physician [facility] [place] [facility] and Critical Care Pager: [phone], If no answer or between  15:00h - 7:00h: call [phone]  12/14/2015 5:38 PM

## 2015-12-29 ENCOUNTER — Other Ambulatory Visit: Payer: Self-pay | Admitting: Internal Medicine

## 2015-12-29 DIAGNOSIS — J84112 Idiopathic pulmonary fibrosis: Secondary | ICD-10-CM

## 2015-12-29 DIAGNOSIS — Z006 Encounter for examination for normal comparison and control in clinical research program: Secondary | ICD-10-CM

## 2015-12-30 ENCOUNTER — Ambulatory Visit (INDEPENDENT_AMBULATORY_CARE_PROVIDER_SITE_OTHER): Payer: Medicare Other | Admitting: Internal Medicine

## 2015-12-30 ENCOUNTER — Other Ambulatory Visit: Payer: Self-pay | Admitting: Internal Medicine

## 2015-12-30 ENCOUNTER — Encounter: Payer: Self-pay | Admitting: Internal Medicine

## 2015-12-30 ENCOUNTER — Ambulatory Visit (INDEPENDENT_AMBULATORY_CARE_PROVIDER_SITE_OTHER)
Admission: RE | Admit: 2015-12-30 | Discharge: 2015-12-30 | Disposition: A | Discharge status: Home / Self Care | Payer: Self-pay | Source: Ambulatory Visit | Attending: Internal Medicine | Admitting: Internal Medicine

## 2015-12-30 VITALS — BP 132/80 | HR 67 | Temp 97.3°F | Resp 16 | Wt 131.0 lb

## 2015-12-30 DIAGNOSIS — J84112 Idiopathic pulmonary fibrosis: Secondary | ICD-10-CM

## 2015-12-30 DIAGNOSIS — Z006 Encounter for examination for normal comparison and control in clinical research program: Secondary | ICD-10-CM

## 2015-12-30 DIAGNOSIS — I158 Other secondary hypertension: Secondary | ICD-10-CM

## 2015-12-30 NOTE — Patient Instructions (Addendum)
ICD-9-CM ICD-10-CM   1. Research study patient V70.7 Z00.6   2. IPF (idiopathic pulmonary fibrosis) (HCC) 516.31 J84.112   3. Other secondary hypertension 405.99 I15.8

## 2015-12-30 NOTE — Progress Notes (Signed)
PRAISE (ClinicalTrials.gov Identifier: [id number])  RESEARCH SUBJECT. Phase 2, randomized, double-blind, placebo-controlled study to evaluate the safety and tolerability of FG-3019 in subjects with IPF, and the efficacy of FG-3019 in slowing the loss of forced vital capacity (FVC) and the progression of IPF in these subjects. FG-3019 is a human recombinant monoclonal antibody that targets connective tissue growth factor (CTGF), which is a key factor in the pathogenesis of fibrosis. The ability of FG-3019 to block CTGF makes it a candidate for treating IPF.PRAISE is sponsored by [employer].  [employer] is a [employer] located in [place], [place]. FG-3019 is administered as an IV infusion every three weeks .    Inclusion Criteria: IPF for </= 5 years, age 75-80 and FVC is >55% and your DLCO >/30% and body weight < 130kg  Key other data: side effects with infusion - Cough > 30%, Fatigue > 20%, Dyspnea > 20%, URI > 15%, Headache > 15%, Diarrhea > 15%. Rare is flushing, arm pain, back pain and hypertension. SEverity 75% is miild-moderate. > 74% have atleast 1 side effect over time.  No overall difference between placebo and drug .................................................................................................................. CRC NOTES  This visit 12/30/2015  for Monica Matthews with [birth year] who is Subject Number [id number] is a research Visit and is for purpose of End of Treatment and is week 48 on PROTOCOL.  Subject returns, she has no new changes to her medications or A.E's.  States her Blood pressure has returned to normal and is convinced it became elevated due to the filter not being placed on last infusion start.  Her CT is today at 1130 and she will have labs drawn.  We will also complete an ECG....  We will know within 3 weeks whether the subject is eligible to go into the extension part of the study.  I will call  Her once it has been determined by the Fibrogen study team.     PI  NOTES S:  Here for end of Rx visit. She had her infusion 12/13/15. THat was last infusion. She had protcol deviation - without IV filter.  She is upset about that. Says that her BP Monica Matthews and despite extended obs at infusion site went home was anxious and upset about it and bp wnent to 200sbp (she checked it) and wen tto ER . Was discharged on po pill. Says bp within range. She feels bp rise was directly related to lack of IV filter. She is glad is behind her now. I did ask her if BP Monica Matthews even before she knew filter was not  There. She replied in affirmative. However, in review of records that I obtained from [facility] Ms Monica Matthews and copied below BP Monica Matthews only after she knew filter was not present. She did admit getting upset and anxious as a result but denied this was cause for rise in bP   She really wants to qualify for roll-ove study   Here is the data related to 7048's vital signs on 08May17, as well as the previous two infusion visit vital signs for reference.  I added the IV filter in line approximately around 13:45.  At that time I mentioned to that patient that I needed to add a piece of tubing that we normally use for infusion.  The 14:18 vital signs were collected by bedside nurse tech while I was conferring with PI/Pharmacy/Monitor.  Around 14:35 I informed patient and spouse that the additional tubing I added was filter tubing and required additional monitoring.  Time TimePoint HR BP 08May2017 Infusion Vitals 13:16 Pre-infusion 66 128/66 13:21 Study Drug Start     13:45 Approximate Filter addition time.       13:54 Study Drug Stop     13:54 Post-Infusion 65 142/70 14:18 End Post-infusion Observation Period 56 133/73 14:35 Approximate time of Notification of IV line filter role     14:55 Additional 30 min observation 58 161/83 15:26 Additional 30 min observation 55 164/92   17Apr2017 Infusion Vitals 12:53 Pre-infusion 86 143/73 13:11 Study Drug Start      13:40 Study Drug Stop     13:41 Post-Infusion 64 135/75 14:10 End Post-infusion observation period 69 132/79   27Mar2017 Infusion Vitals 09:03 Pre-infusion 68 145/69 09:27 Study Drug Start     09:58 Infusion Vitals 68 145/81 10:00 Study Drug Stop     10:28 End Post-infusion observation period 68 147/81    O: See source doc Basal crackles as before - no change   LABS  - ekg - no clinically significant  - CT chest Ct Chest High Resolution  12/30/2015  CLINICAL DATA:  80 year old female with history of idiopathic pulmonary fibrosis. Fibrinogen research study patient. EXAM: CT CHEST WITHOUT CONTRAST TECHNIQUE: Multidetector CT imaging of the chest was performed following the standard protocol without intravenous contrast. High resolution imaging of the lungs, as well as inspiratory and expiratory imaging, was performed. COMPARISON:  Chest CT 08/30/2015. FINDINGS: Mediastinum/Lymph Nodes: Heart size is borderline enlarged. There is no significant pericardial fluid, thickening or pericardial calcification. There is atherosclerosis of the thoracic aorta, the great vessels of the mediastinum and the coronary arteries, including calcified atherosclerotic plaque in the left main, left anterior descending, left circumflex and right coronary arteries. Mild calcifications of the aortic valve. No pathologically enlarged mediastinal or hilar lymph nodes. Please note that accurate exclusion of hilar adenopathy is limited on noncontrast CT scans. Esophagus is unremarkable in appearance. No axillary lymphadenopathy. Lungs/Pleura: High-resolution images demonstrate extensive subpleural reticulation, parenchymal banding, traction bronchiectasis and honeycombing throughout the lungs bilaterally with a definitive craniocaudal gradient. No significant progression of disease is identified when compared 08/30/2015. Inspiratory and expiratory imaging demonstrates minimal air trapping,  indicative of small airways disease. No acute consolidative airspace disease. No pleural effusions. No suspicious appearing pulmonary nodules or masses. Upper abdomen: Unremarkable. Musculoskeletal: There are no aggressive appearing lytic or blastic lesions noted in the visualized portions of the skeleton. Bilateral subpectoral breast implants incidentally noted. IMPRESSION: 1. The appearance of the lungs is again compatible with interstitial lung disease, and the pattern is considered diagnostic of usual interstitial pneumonia (UIP). There does not appear to be progression of disease compared to prior study 08/30/2015. 2. Atherosclerosis, including left main and 3 vessel coronary artery disease. Assessment for potential risk factor modification, dietary therapy or pharmacologic therapy may be warranted, if clinically indicated. 3. Additional incidental findings, as above. Electronically Signed   By: Trudie Reed M.D.   On: 12/30/2015 16:22   bloodlabs reviewed on 01/05/2016  - LFT normal, Creat 0.69mg %, LDH 315 (286 in sept 2017 and this is baseline), CBC normal,   - Spirometry week 36 on October 11, 2015  - fvc 1.62L.71.87%, Fev1 1.42l/84.97%, Ratio 117.7% -> declined from fvc 1.86L/81% in January 18, 2015   - Spirometry this visit     - pending at time of this note  ASSESSMENT/PLAN  -    ICD-9-CM ICD-10-CM   1. Research study patient V70.7 Z00.6   2. IPF (idiopathic pulmonary fibrosis) (HCC) 516.31  J84.112   3. Other secondary hypertension 405.99 I15.8    IPF/Resarc - Stable on CT. Progressive on spiro. She has finished her end of Rx. We will await word from sponsor about eligibitly for roll-over  #hypertensive response - this was related to protocol deviation without IV filter. However, after detailed review (after she left) I feel was not related to study drug or lack of IV filter but related to anxiety reaction related to lack of IV filter.  Fu per protocol   Dr. Kalman ShanMurali Monica Matthews, M.D.,  [facility].C.C.P Pulmonary and Critical Care Medicine Staff Physician [facility] [place] Pulmonary and Critical Care Pager: [phone], If no answer or between  15:00h - 7:00h: call [phone]  01/07/2016 1:44 AM

## 2016-01-07 DIAGNOSIS — I158 Other secondary hypertension: Secondary | ICD-10-CM | POA: Insufficient documentation

## 2016-01-12 ENCOUNTER — Encounter: Payer: Self-pay | Admitting: Internal Medicine

## 2016-01-12 ENCOUNTER — Ambulatory Visit (INDEPENDENT_AMBULATORY_CARE_PROVIDER_SITE_OTHER): Payer: Medicare Other | Admitting: Internal Medicine

## 2016-01-12 VITALS — BP 112/70 | HR 72 | Temp 98.6°F | Resp 16 | Ht 60.0 in | Wt 132.0 lb

## 2016-01-12 DIAGNOSIS — Z006 Encounter for examination for normal comparison and control in clinical research program: Secondary | ICD-10-CM

## 2016-01-12 DIAGNOSIS — J84112 Idiopathic pulmonary fibrosis: Secondary | ICD-10-CM

## 2016-01-12 NOTE — Progress Notes (Signed)
PRAISE (ClinicalTrials.gov Identifier: [id number])  RESEARCH SUBJECT. Phase 2, randomized, double-blind, placebo-controlled study to evaluate the safety and tolerability of FG-3019 in subjects with IPF, and the efficacy of FG-3019 in slowing the loss of forced vital capacity (FVC) and the progression of IPF in these subjects. FG-3019 is a human recombinant monoclonal antibody that targets connective tissue growth factor (CTGF), which is a key factor in the pathogenesis of fibrosis. The ability of FG-3019 to block CTGF makes it a candidate for treating IPF.PRAISE is sponsored by [employer]  [employer] is a [employer] company located in [place]. FG-3019 is administered as an IV infusion every three weeks .    Inclusion Criteria: IPF for </= 5 years, age 78-80 and FVC is >55% and your DLCO >/30% and body weight < 130kg  Key other data: side effects with infusion - Cough > 30%, Fatigue > 20%, Dyspnea > 20%, URI > 15%, Headache > 15%, Diarrhea > 15%. Rare is flushing, arm pain, back pain and hypertension. SEverity 75% is miild-moderate. > 74% have atleast 1 side effect over time.  No overall difference between placebo and drug ..................................................................................................................  This visit 01/12/2016  for Monica Matthews with October 26, 1935 who is Subject Number [id number]  is a research Visit and is for purpose of follow up and is End of Study (EOS) on PROTOCOL.  Subject returns to discuss final sponsor answer as to her moving into extension.  Subject did complete all required labs and DNA.  She is formal completed with the study on this day.    sUBJECTIVE  EOS visit. Feels well.  OBJECTIVE  Filed Vitals:   01/12/16 1142  BP: 112/70  Pulse: 72  Temp: 98.6 F (37 C)  TempSrc: Oral  Resp: 16  Height: 5' (1.524 m)  Weight: 132 lb (59.875 kg)  SpO2: 96%    Discussion only - I saw her face to face   PFT data 6'13/23 Jul 2015  10/11/2015 12/30/15    Fev1 1.7L/101% 1.63L/95% 1.42L/85% 1.40L  FVC 1.86L/81% 1.86L/81% 1.62L/71.87% 1.63  Ratio      fef 25-75%      TLC      DLCO 9.09L/48%      12% declie in FVC in mrach 2017 compared to earlier    A/P   ICD-9-CM ICD-10-CM   1. Research study patient V70.7 Z00.6   2. IPF (idiopathic pulmonary fibrosis) (HCC) 516.31 J84.112     Progressive disease Does not qualify for roll -ver as communictaed to us by Sponsor Advised clinial fu with DR Guido SanderFulkerson She is disappointed but told us to call her if other trials are present Lab work 01/12/2016 per protocol    Dr. Kalman ShanMurali Ramaswamy, M.D., [facility].C.C.P Pulmonary and Critical Care Medicine Staff Physician [facility] [place] Pulmonary and Critical Care Pager: [phone], If no answer or between  15:00h - 7:00h: call [phone]  01/12/2016 12:15 PM

## 2016-01-12 NOTE — Patient Instructions (Addendum)
ICD-9-CM ICD-10-CM   1. Research study patient V70.7 Z00.6   2. IPF (idiopathic pulmonary fibrosis) (HCC) 516.31 J84.112    End of study

## 2016-03-09 ENCOUNTER — Telehealth: Payer: Self-pay | Admitting: *Deleted

## 2016-05-18 IMAGING — CT CT CHEST HIGH RESOLUTION W/O CM
1 of 7 series · 4 of 36 positions shown, 5 images · non-contrast
Comparison: None.

CLINICAL DATA: 78-year-old female with history of idiopathic
pulmonary fibrosis diagnosed in June 2011, now with worsening
shortness of breath. Fibrogen study.

EXAM:
CT CHEST WITHOUT CONTRAST
TECHNIQUE: Multidetector CT imaging of the chest was performed following the
standard protocol without intravenous contrast. High resolution
imaging of the lungs, as well as inspiratory and expiratory imaging,
was performed.

[Series 603: cor · coronal · 0.58mm/px · 4 of 107 slices shown, 5 images]
[im 22/107  mediastinal]
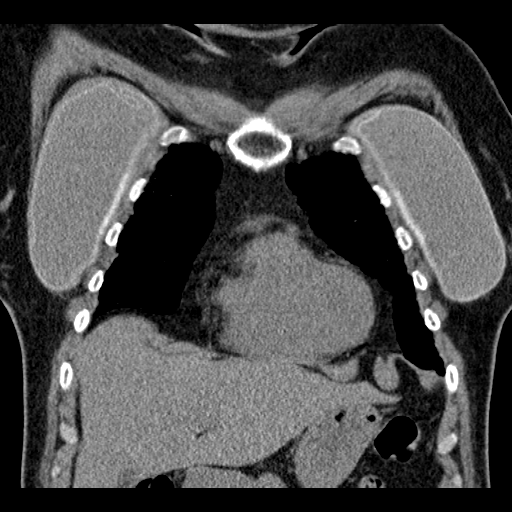
[im 22/107  lung]
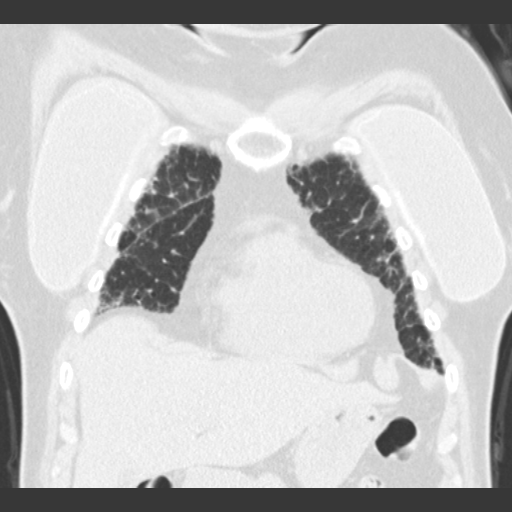
[im 43/107  lung]
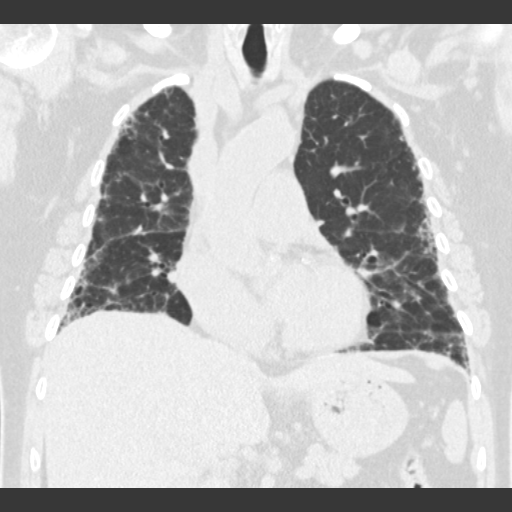
[im 64/107  lung]
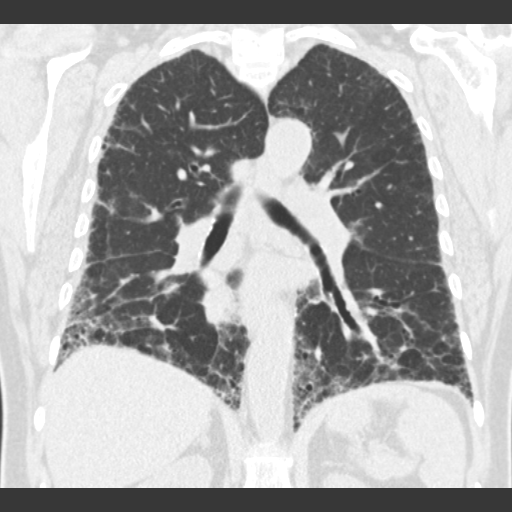
[im 85/107  lung]
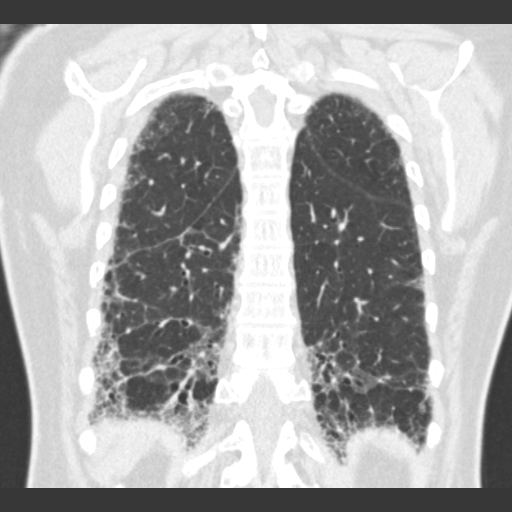

[4 of 36 positions shown; findings below may reference images not displayed]

FINDINGS: Mediastinum/Lymph Nodes: Heart size is normal. There is no
significant pericardial fluid, thickening or pericardial
calcification. There is atherosclerosis of the thoracic aorta, the
great vessels of the mediastinum and the coronary arteries,
including calcified atherosclerotic plaque in the left main, left
anterior descending, left circumflex and right coronary arteries.
Multiple borderline enlarged mediastinal and bilateral hilar lymph
nodes are noted, and are nonspecific. No pathologically enlarged
mediastinal or hilar lymph nodes are noted. Esophagus is
unremarkable in appearance. No axillary lymphadenopathy.

Lungs/Pleura: High-resolution images demonstrates extensive areas of
subpleural and peribronchovascular reticulation, parenchymal
banding, traction bronchiectasis and frank honeycombing, with a very
strong craniocaudal gradient. Inspiratory and expiratory imaging
demonstrates moderate air trapping, indicative of small airways
disease. No acute consolidative airspace disease. No pleural
effusions. No suspicious appearing pulmonary nodules or masses.

Upper Abdomen: Unremarkable.

Musculoskeletal/Soft Tissues: There are no aggressive appearing
lytic or blastic lesions noted in the visualized portions of the
skeleton. Bilateral subpectoral breast implants incidentally noted.
IMPRESSION: 1. Imaging findings are considered diagnostic of usual interstitial
pneumonia (UIP), as above.
2. Atherosclerosis, including left main and 3 vessel coronary artery
disease. Assessment for potential risk factor modification, dietary
therapy or pharmacologic therapy may be warranted, if clinically
indicated.
3. Moderate air trapping, indicative of small airways disease.
4. Additional incidental findings, as above.

## 2017-07-07 DEATH — deceased

## 2018-03-07 ENCOUNTER — Telehealth: Payer: Self-pay | Admitting: Internal Medicine

## 2018-03-07 NOTE — Telephone Encounter (Signed)
This patient participated in the clinical trial [id number], Fibrogen Phase 2, subject number [id number]. During this trial this patient did receive the study drug of Pamrevlumab. A letter has been sent to the patient informing her of this.   [employer] CorporationJennifer Reichen Hutzler,CCRC2 [employer], [facility]

## 2018-03-08 NOTE — Telephone Encounter (Signed)
Thanks  Dr. Anaih Brander, M.D., F.C.C.P Pulmonary and Critical Care Medicine Staff Physician, [facility] Director - Interstitial Lung Disease  Program  [facility] [place], [place], [address]  Pager: [phone], If no answer or between  15:00h - 7:00h: call [phone] Telephone: [phone]
